# Patient Record
Sex: Male | Born: 1985 | Race: White | Hispanic: Yes | Marital: Married | Smoking: Never smoker
Health system: Southern US, Community
[De-identification: ages and names within clinical notes are randomized; demographics above are authoritative.]

## PROBLEM LIST (undated history)

## (undated) DIAGNOSIS — M1990 Unspecified osteoarthritis, unspecified site: Secondary | ICD-10-CM

## (undated) DIAGNOSIS — R0789 Other chest pain: Secondary | ICD-10-CM

## (undated) HISTORY — DX: Other chest pain: R07.89

## (undated) HISTORY — PX: APPENDECTOMY: SHX54

## (undated) HISTORY — PX: WISDOM TOOTH EXTRACTION: SHX21

---

## 2008-11-02 ENCOUNTER — Encounter (INDEPENDENT_AMBULATORY_CARE_PROVIDER_SITE_OTHER): Payer: Self-pay | Admitting: General Surgery

## 2008-11-02 ENCOUNTER — Observation Stay (HOSPITAL_COMMUNITY): Admission: EM | Admit: 2008-11-02 | Discharge: 2008-11-03 | Payer: Self-pay | Admitting: Emergency Medicine

## 2010-08-15 ENCOUNTER — Encounter: Admission: RE | Admit: 2010-08-15 | Discharge: 2010-08-15 | Payer: Self-pay | Admitting: Chiropractic Medicine

## 2011-01-21 LAB — CBC
HCT: 47.1 % (ref 39.0–52.0)
MCHC: 33.8 g/dL (ref 30.0–36.0)
MCV: 86.6 fL (ref 78.0–100.0)
Platelets: 198 10*3/uL (ref 150–400)
RDW: 12.7 % (ref 11.5–15.5)

## 2011-01-21 LAB — POCT I-STAT, CHEM 8
Creatinine, Ser: 1.1 mg/dL (ref 0.4–1.5)
HCT: 49 % (ref 39.0–52.0)
Hemoglobin: 16.7 g/dL (ref 13.0–17.0)
Sodium: 140 mEq/L (ref 135–145)
TCO2: 29 mmol/L (ref 0–100)

## 2011-01-21 LAB — URINALYSIS, ROUTINE W REFLEX MICROSCOPIC
Hgb urine dipstick: NEGATIVE
Protein, ur: NEGATIVE mg/dL
Urobilinogen, UA: 0.2 mg/dL (ref 0.0–1.0)

## 2011-01-21 LAB — DIFFERENTIAL
Basophils Absolute: 0.2 10*3/uL — ABNORMAL HIGH (ref 0.0–0.1)
Basophils Relative: 1 % (ref 0–1)
Eosinophils Absolute: 0 10*3/uL (ref 0.0–0.7)
Eosinophils Relative: 0 % (ref 0–5)
Lymphs Abs: 2.1 10*3/uL (ref 0.7–4.0)

## 2011-02-19 NOTE — Op Note (Signed)
NAME:  Dennis Carrillo, Dennis Carrillo  ACCOUNT NO.:  [id number]   MEDICAL RECORD NO.:  [id number]          PATIENT TYPE:  OBV   LOCATION:  2550                         FACILITY:  [facility]   PHYSICIAN:  Lennie Muckle, MD      DATE OF BIRTH:  Sep 20, 1986   DATE OF PROCEDURE:  DATE OF DISCHARGE:                               OPERATIVE REPORT   PREOPERATIVE DIAGNOSIS:  Acute appendicitis.   POSTOPERATIVE DIAGNOSIS:  Acute appendicitis.   PROCEDURE:  Laparoscopic appendectomy.   SURGEON:  Lennie Muckle, MD   ASSISTANT:  None.   ANESTHESIA:  General endotracheal anesthesia.   FINDINGS:  Long appendix adherent to the cecum.   SPECIMENS:  Appendix.   ESTIMATED BLOOD LOSS:  No immediate blood loss.   COMPLICATIONS:  No immediate complications.   DRAINS:  No drains were placed.   INDICATIONS FOR PROCEDURE:  Dennis Carrillo is a 25 year old male  who had abdominal pain, had a white count of 13.6.  He had CT findings  of a thickened appendix.  Informed consent was obtained for laparoscopic  appendectomy.  He received Zosyn preoperatively.   DETAILS OF PROCEDURE:  Dennis Carrillo was identified in the  preoperative holding area.  He received his IV, status post taken to the  operating room.  Once in the operating room, he was placed in the supine  position.  After administration of general endotracheal anesthesia, his  abdomen was prepped and draped in usual sterile fashion.  A time-out  procedure indicating the patient and procedure was formed.  I placed an  incision at the umbilicus.  Using the OptiVu, I placed a 5-mm trocar in  the abdominal cavity.  After adequate pneumo insufflation, I inspected  the abdominal cavity.  There was no evidence of injury by placing the  trocar.  I then placed a 5-mm trocar in the right side of the abdomen.  The patient was placed in a Trendelenburg right side up position.  The  cecum was identified in the right side of the abdomen.  The appendix  was  coursing along the right lateral wall.  I placed a 12-mm port in the  left lower quadrant.  I grasped the appendix and dissected sharply and  bluntly the lateral attachments of the appendix in the abdominal wall.  There was some mild inflammatory changes.  No purulent fluid noted.  I  then continued dissecting out the appendix.  I thought it easier to  dissect at the base and I stapled across the base with a laparoscopic  GIA stapler.  I then stapled across the mesoappendix.  I then continued  dissecting the appendix, which did course up along the wall of the  abdominal cavity adherent to the cecum.  This was rather a long  extensive dissection.  I continued dissecting the appendix.  I had  another piece of mesoappendix that I clipped and divided with  laparoscopic scissors.  As evident, we fully dissected at the tip of the  appendix.  I placed the appendix in the EndoCatch bag removed it from  the abdomen.  I then irrigated the abdomen with approximately a liter  and half of saline.  There appeared to be no evidence of bleeding.  Staple line was intact.  There appeared to be no evidence of injury in  the abdominal cavity.  Using a 0 Vicryl suture, I then closed the  fascial defect.  I then removed the trocars from the left lower  quadrant.  Finger palpation revealed closure.  Pneumoperitoneum was  released.  Trocars were removed.  Skin was closed with 4-0 Monocryl.  Dermabond placed upon dressing.  The patient had no catheter, was  extubated and transferred to postanesthesia care unit in stable  condition. He was given 1 dose of Zosyn postoperatively and will likely  be discharged home later in the morning.      Lennie Muckle, MD  Electronically Signed     ALA/MEDQ  D:  11/03/2008  T:  11/03/2008  Job:  [id number]

## 2011-02-19 NOTE — H&P (Signed)
NAME:  Dennis Carrillo, Dennis Carrillo NO.:  [id number]   MEDICAL RECORD NO.:  [id number]          PATIENT TYPE:  OBV   LOCATION:  5121                         FACILITY:  [facility]   PHYSICIAN:  Lennie Muckle, MD      DATE OF BIRTH:  06/12/1986   DATE OF ADMISSION:  11/02/2008  DATE OF DISCHARGE:                              HISTORY & PHYSICAL   REASON FOR ADMISSION:  Appendicitis.   HISTORY OF PRESENT ILLNESS:  Dennis Carrillo is 22-year male who  had onset abdominal pain approximately 1-2 days ago.  It was located  generally over the abdomen.  It then moved to his right side.  The pain  persisted today.  He had associated nausea, vomiting, and diarrhea.  He  came to the emergency department and had a CT scan, which did reveal a  thickened appendiceal inflammation.  White count was elevated at 13.6.   PAST MEDICAL HISTORY:  Negative.   FAMILY HISTORY:  Negative.   SOCIAL HISTORY:  Negative.   ALLERGIES:  No drug allergies.   MEDICATIONS:  No medications.   REVIEW OF SYSTEMS:  Negative.   PHYSICAL EXAMINATION:  VITAL SIGNS:  Temperature is 98.5, pulse is 82,  blood pressure 117/76.  GENERAL:  Overall, he is a young male in no acute distress, appears his  stated age, appears overall healthy.  HEENT:  Head is normocephalic.  Sclerae are clear.  CHEST:  Clear to auscultation bilaterally.  CARDIOVASCULAR:  Regular rate rhythm.  ABDOMEN:  He does have some tenderness in the right lower quadrant.  No  peritoneal signs.  SKIN:  Normal.  No rashes are seen.  No jaundice is noted.   CT is reviewed.  There is a thickened appendix with inflammation.   ASSESSMENT AND PLAN:  Acute appendicitis.   PLAN:  Laparoscopic appendectomy.  If all goes well, he may likely be  discharged home tomorrow.  I did talk to the family about the  possibility of a postoperative abscess.  He will be given Zosyn  preoperatively.      Lennie Muckle, MD  Electronically Signed     ALA/MEDQ   D:  11/02/2008  T:  11/03/2008  Job:  [id number]

## 2016-11-07 ENCOUNTER — Ambulatory Visit (HOSPITAL_COMMUNITY)
Admission: EM | Admit: 2016-11-07 | Discharge: 2016-11-07 | Disposition: A | Discharge status: Other Acute Inpatient Hospital | Payer: Self-pay

## 2018-05-22 ENCOUNTER — Other Ambulatory Visit: Payer: Self-pay | Admitting: Family Medicine

## 2018-05-22 ENCOUNTER — Ambulatory Visit
Admission: RE | Admit: 2018-05-22 | Discharge: 2018-05-22 | Disposition: A | Discharge status: Home / Self Care | Payer: Managed Care, Other (non HMO) | Source: Ambulatory Visit | Attending: Family Medicine | Admitting: Family Medicine

## 2018-05-22 DIAGNOSIS — M542 Cervicalgia: Secondary | ICD-10-CM

## 2018-05-22 DIAGNOSIS — M25562 Pain in left knee: Secondary | ICD-10-CM

## 2018-07-25 ENCOUNTER — Encounter (HOSPITAL_COMMUNITY): Payer: Self-pay | Admitting: Emergency Medicine

## 2018-07-25 ENCOUNTER — Emergency Department (HOSPITAL_COMMUNITY)
Admission: EM | Admit: 2018-07-25 | Discharge: 2018-07-25 | Disposition: A | Discharge status: Home / Self Care | Payer: 59 | Attending: Emergency Medicine | Admitting: Emergency Medicine

## 2018-07-25 ENCOUNTER — Emergency Department (HOSPITAL_COMMUNITY): Payer: 59

## 2018-07-25 DIAGNOSIS — R202 Paresthesia of skin: Secondary | ICD-10-CM | POA: Insufficient documentation

## 2018-07-25 DIAGNOSIS — M542 Cervicalgia: Secondary | ICD-10-CM | POA: Diagnosis present

## 2018-07-25 DIAGNOSIS — R531 Weakness: Secondary | ICD-10-CM | POA: Diagnosis not present

## 2018-07-25 DIAGNOSIS — D492 Neoplasm of unspecified behavior of bone, soft tissue, and skin: Secondary | ICD-10-CM

## 2018-07-25 DIAGNOSIS — R51 Headache: Secondary | ICD-10-CM | POA: Insufficient documentation

## 2018-07-25 HISTORY — DX: Neoplasm of unspecified behavior of bone, soft tissue, and skin: D49.2

## 2018-07-25 MED ORDER — NAPROXEN 250 MG PO TABS
500.0000 mg | ORAL_TABLET | Freq: Once | ORAL | Status: AC
  Administered 2018-07-25: 500 mg via ORAL
  Filled 2018-07-25: qty 2

## 2018-07-25 MED ORDER — LORAZEPAM 2 MG/ML IJ SOLN
1.0000 mg | Freq: Once | INTRAMUSCULAR | Status: AC | PRN
  Administered 2018-07-25: 1 mg via INTRAVENOUS
  Filled 2018-07-25: qty 1

## 2018-07-25 MED ORDER — GADOBUTROL 1 MMOL/ML IV SOLN
7.0000 mL | Freq: Once | INTRAVENOUS | Status: AC | PRN
  Administered 2018-07-25: 7 mL via INTRAVENOUS

## 2018-07-25 NOTE — ED Notes (Signed)
ED Provider at bedside. 

## 2018-07-25 NOTE — ED Notes (Signed)
Pt is back from MRI.

## 2018-07-25 NOTE — ED Notes (Signed)
Per neurosurgeon pt can be discharged and to follow up with neurosurgery outpatient follow up. PA aware.

## 2018-07-25 NOTE — ED Provider Notes (Signed)
[facility] EMERGENCY DEPARTMENT Provider Note   CSN: [id number] Arrival date & time: 07/25/18  7824     History   Chief Complaint Chief Complaint  Patient presents with  . Neck Pain    HPI Dennis Carrillo is a 32 y.o. male.  Patient with neck pain ongoing over the past 3 months.  Pain is on the left side of the neck and is worse when he looks to the right.  He also has pain that radiates up the back of his head causing him to have a headache.  States that sometimes his vision is blurry when he looks at something that is far away and sometimes he believes sees black spots when he opens his eyes abruptly, however denies any other vision changes.  He denies any weakness in the arms of the legs -- however upon more specific questioning, he reports some subjective weakness and tingling in the left arm that is been ongoing for about 3 years.  No difficulty walking.  He works at [employer] and does a lot of movement with his upper body.  He denies injury at onset.  States that he saw his primary care doctor who gave him muscle relaxers and pain medication which did not really help.  He has the pain every day.     No past medical history on file.  There are no active problems to display for this patient.   History reviewed. No pertinent surgical history.      Home Medications    Prior to Admission medications   Not on File    Family History No family history on file.  Social History Social History   Tobacco Use  . Smoking status: Not on file  Substance Use Topics  . Alcohol use: Not on file  . Drug use: Not on file     Allergies   Patient has no allergy information on record.   Review of Systems Review of Systems  Constitutional: Negative for activity change and fever.  HENT: Negative for rhinorrhea and sore throat.   Eyes: Negative for redness.  Respiratory: Negative for cough.   Cardiovascular: Negative for chest pain.    Gastrointestinal: Negative for abdominal pain, diarrhea, nausea and vomiting.  Genitourinary: Negative for dysuria.  Musculoskeletal: Positive for arthralgias, myalgias, neck pain and neck stiffness. Negative for back pain, gait problem and joint swelling.  Skin: Negative for rash and wound.  Neurological: Positive for weakness and numbness. Negative for headaches.     Physical Exam Updated Vital Signs BP (!) 131/92 (BP Location: Right Arm)   Pulse 67   Temp 98.5 F (36.9 C) (Oral)   Resp 18   SpO2 97%   Physical Exam  Constitutional: He is oriented to person, place, and time. He appears well-developed and well-nourished.  HENT:  Head: Normocephalic and atraumatic.  Right Ear: Tympanic membrane, external ear and ear canal normal.  Left Ear: Tympanic membrane, external ear and ear canal normal.  Nose: Nose normal.  Mouth/Throat: Uvula is midline, oropharynx is clear and moist and mucous membranes are normal.  Eyes: Pupils are equal, round, and reactive to light. Conjunctivae, EOM and lids are normal.  Neck: Normal range of motion. Neck supple.  Cardiovascular: Normal rate and regular rhythm.  Pulmonary/Chest: Effort normal and breath sounds normal. No respiratory distress.  Abdominal: Soft. There is no tenderness.  Musculoskeletal: Normal range of motion.       Cervical back: He exhibits normal range of motion,  no tenderness and no bony tenderness.  Patient with good range of motion of the neck but reports worsening pain in the left neck when he looks towards the right side.  Neurological: He is alert and oriented to person, place, and time. He has normal strength and normal reflexes. No cranial nerve deficit or sensory deficit. He exhibits normal muscle tone. He displays a negative Romberg sign. Coordination and gait normal. GCS eye subscore is 4. GCS verbal subscore is 5. GCS motor subscore is 6.  5/5 strength symmetric bilateral upper and lower extremities  Skin: Skin is warm  and dry.  Psychiatric: He has a normal mood and affect.  Nursing note and vitals reviewed.    ED Treatments / Results  Labs (all labs ordered are listed, but only abnormal results are displayed) Labs Reviewed - No data to display  EKG None  Radiology No results found.  Procedures Procedures (including critical care time)  Medications Ordered in ED Medications - No data to display   Initial Impression / Assessment and Plan / ED Course  I have reviewed the triage vital signs and the nursing notes.  Pertinent labs & imaging results that were available during my care of the patient were reviewed by me and considered in my medical decision making (see chart for details).     Patient seen and examined.  Given duration of pain, will check cervical spine films to evaluate for any signs of arthritis or other bony abnormality.  Initial impression is that symptoms seem to be musculoskeletal in nature.  They are worse with movement.  He does not have any neuro deficits.  He has some vision complaints after I asked him specifically, however do not really fit pattern expected with acute vascular compromise in the neck such as from a dissection or arterial occlusion.  I do not feel that angiography is necessary.  Given the fact that he has already used muscle relaxer medications and pain medication, and next that may be orthopedic referral.  Vital signs reviewed and are as follows: BP (!) 131/92 (BP Location: Right Arm)   Pulse 67   Temp 98.5 F (36.9 C) (Oral)   Resp 18   SpO2 97%   11:49 AM abnormality again noted on cervical spine imaging.  Given prolonged duration of symptoms without improvement with conservative measures --feel that MRI is warranted today for further evaluation.  Discussed with Dr. Ronnald Nian.  Patient updated and agrees with plan.  3:33 PM MRI shows a large schwannoma.  Patient updated.  Will discuss with neurosurgery.  Spoke with Dr. Joaquim Nam who will see patient  in the ED.   4:53 PM Handoff to [facility] PA-C at shift change. Awaiting neurosurg consultation in ED. Dispo per plan.   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   ED Discharge Orders    None       Carlisle Cater, PA-C 07/25/18 [place], [place], DO 07/25/18 1932

## 2018-07-25 NOTE — ED Notes (Signed)
Patient transported to MRI 

## 2018-07-25 NOTE — Consult Note (Signed)
Neurosurgery Consultation  Reason for Consult: Cervical spine tumor Referring Physician: Ronnald Nian  CC: Arm weakness  HPI: This is a 32 y.o. man that presents with acute on chronic left sided arm weakness and worsening ambulation. He has noticed symptoms over the past 3 years, came to the ED because he started falling to the left and having worsening pain in his neck and left arm. He has noticed some difficulty with fine motor movement in the hands.   ROS: A 14 point ROS was performed and is negative except as noted in the HPI.   PMHx: History reviewed. No pertinent past medical history. FamHx: No family history on file. SocHx:  has no tobacco, alcohol, and drug history on file.  Exam: Vital signs in last 24 hours: Temp:  [98.3 F (36.8 C)-98.6 F (37 C)] 98.3 F (36.8 C) (10/19 1453) Pulse Rate:  [60-67] 67 (10/19 2006) Resp:  [18-19] 18 (10/19 2006) BP: (117-131)/(76-92) 119/80 (10/19 2006) SpO2:  [96 %-100 %] 96 % (10/19 2006) General: Awake, alert, cooperative, lying in bed in NAD Head: normocephalic and atruamatic HEENT: neck supple Pulmonary: breathing room air comfortably, no evidence of increased work of breathing Cardiac: RRR Abdomen: S NT ND Extremities: warm and well perfused x4 Neuro: AOx3, PERRL, EOMI, FS Strength 5/5 on R, LUE diffusely 4+/5, +mild hoffman's on the left, diffusely decreased sensation in entire LUE, pain radiating into the R C6 distribution Reflexes 3+ in BLE w/ clonus and upgoing toes bilaterally   Assessment and Plan: 32 y.o. man with neck pain and symptoms of cervical myelopathy. 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  -discussed pathology and plan with patient and his partner. I explained that, if untreated, it will continue to grow and eventually cause paralysis and therefore it must be treated by someone in the near  future and emphasized the high importance of maintaining follow up, which they agreed to. -no acute intervention indicated, slow growing with slowly progressive symptoms, no acute worsening. Will schedule for elective surgical resection. Pt agreed to call if he notices any significant worsening before then.  Judith Part, MD 07/25/18 8:25 PM [facility]

## 2018-07-25 NOTE — ED Notes (Signed)
Neurosurgeon at bedside °

## 2018-07-25 NOTE — ED Provider Notes (Signed)
Care assumed from  [facility], PA-C at shift change with neurosurgery to see the patient. Please see his note for further details.   In brief, this patient is a 32 y.o. male presenting with neck pain. He was found to have a mass lesion extending through the left neural foramen at C5-6 that is most consistent with a nerve sheath tumor and has significant mass-effect on the cervical spine.  No noted deficits on exam.  Neurosurgery to see the patient.  PLAN: Neurosurgery to see the patient.   MDM:  Neurosurgery has seen the patient. Dr. Joaquim Nam recommends outpatient follow-up. His office will be in contact with the patient on Monday, 10/21 to schedule appointment for surgery/appt.   I advised the patient to follow-up with neurosurgery. He is to call Tuesday (information provided) if he does not receive a call from Dr. Sheldon Silvan office on Monday.  Specific return precautions discussed. Time was given for all questions to be answered. The patient verbalized understanding and agreement with plan. The patient appears safe for discharge home.  1. Nerve sheath tumor       Jillyn Ledger, PA-C 07/25/18 2001    Hayden Rasmussen, MD 07/26/18 [phone]

## 2018-07-25 NOTE — Discharge Instructions (Signed)
Dr. Zada Finders of neurosurgery has seen you in the department. He discussed his office will be in contact with you on Monday to schedule an appointment. If you do not hear from them on Monday, please call their office first thing on Tuesday.   Your MRI IMPRESSION: 1. Avidly enhancing heterogeneous mass lesion extends through and distends the left neural foramen at C5-6. There is a significant intracanalicular component. 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 2. Significant mass effect on the cervical spinal cord due to the nerve sheath tumor. The cord is displaced to the right. No definite cord signal abnormality is present.  Please return for any numbness or weakness in your arms or legs.  If you pee or defecate on yourself or if you have difficulties walking. If you develop worsening or new concerning symptoms you can return to the emergency department for re-evaluation.

## 2018-07-25 NOTE — ED Triage Notes (Signed)
Pt to ER for neck pain x3 months. Worsened by lateral movement. NAD. Denies injury.

## 2018-07-25 NOTE — ED Notes (Signed)
MRI contacted for ETA. 2 inpatients ahead of the patient then he would be next.

## 2018-07-30 ENCOUNTER — Other Ambulatory Visit: Payer: Self-pay | Admitting: Neurological Surgery

## 2018-08-06 NOTE — Progress Notes (Addendum)
PCP: Rodena Piety, MD  Cardiologist: pt denies  EKG: pt denies past year  Stress test: pt denies  ECHO: pt denies  Cardiac Cath: pt denies   Chest x-ray: pt denies past year, no recent respiratory infections/complications  Spanish interpreter requested for day of surgery.

## 2018-08-06 NOTE — Pre-Procedure Instructions (Signed)
[facility]  08/06/2018      [facility] [place], [place] - [address] [place] [address] Phone: [phone] Fax: [phone]    Your procedure is scheduled on August 19, 2018.  Report to [facility] Admitting at 630 AM.  Call this number if you have problems the morning of surgery:  [phone]   Remember:  Do not eat or drink after midnight.    Take these medicines the morning of surgery with A SIP OF WATER -none  7 days prior to surgery STOP taking any Aspirin (unless otherwise instructed by your surgeon), Aleve, Naproxen, Ibuprofen, Motrin, Advil, Goody's, BC's, all herbal medications, fish oil, and all vitamins     Do not wear jewelry  Do not wear lotions, powders, or colognes, or deodorant.  Men may shave face and neck.  Do not bring valuables to the hospital.  [facility] is not responsible for any belongings or valuables.  Contacts, dentures or bridgework may not be worn into surgery.  Leave your suitcase in the car.  After surgery it may be brought to your room.  For patients admitted to the hospital, discharge time will be determined by your treatment team.  Patients discharged the day of surgery will not be allowed to drive home.    [facility]- Preparing For Surgery  Before surgery, you can play an important role. Because skin is not sterile, your skin needs to be as free of germs as possible. You can reduce the number of germs on your skin by washing with CHG (chlorahexidine gluconate) Soap before surgery.  CHG is an antiseptic cleaner which kills germs and bonds with the skin to continue killing germs even after washing.    Oral Hygiene is also important to reduce your risk of infection.  Remember - BRUSH YOUR TEETH THE MORNING OF SURGERY WITH YOUR REGULAR TOOTHPASTE  Please do not use if you have an allergy to CHG or antibacterial soaps. If your skin becomes reddened/irritated  stop using the CHG.  Do not shave (including legs and underarms) for at least 48 hours prior to first CHG shower. It is OK to shave your face.  Please follow these instructions carefully.   1. Shower the NIGHT BEFORE SURGERY and the MORNING OF SURGERY with CHG.   2. If you chose to wash your hair, wash your hair first as usual with your normal shampoo.  3. After you shampoo, rinse your hair and body thoroughly to remove the shampoo.  4. Use CHG as you would any other liquid soap. You can apply CHG directly to the skin and wash gently with a scrungie or a clean washcloth.   5. Apply the CHG Soap to your body ONLY FROM THE NECK DOWN.  Do not use on open wounds or open sores. Avoid contact with your eyes, ears, mouth and genitals (private parts). Wash Face and genitals (private parts)  with your normal soap.  6. Wash thoroughly, paying special attention to the area where your surgery will be performed.  7. Thoroughly rinse your body with warm water from the neck down.  8. DO NOT shower/wash with your normal soap after using and rinsing off the CHG Soap.  9. Pat yourself dry with a CLEAN TOWEL.  10. Wear CLEAN PAJAMAS to bed the night before surgery, wear comfortable clothes the morning of surgery  11. Place CLEAN SHEETS on your bed the night of your first shower  and DO NOT SLEEP WITH PETS.  Day of Surgery:  Do not apply any deodorants/lotions.  Please wear clean clothes to the hospital/surgery center.   Remember to brush your teeth WITH YOUR REGULAR TOOTHPASTE.  Please read over the following fact sheets that you were given.

## 2018-08-07 ENCOUNTER — Encounter (HOSPITAL_COMMUNITY)
Admission: RE | Admit: 2018-08-07 | Discharge: 2018-08-07 | Disposition: A | Discharge status: Home / Self Care | Payer: 59 | Source: Ambulatory Visit | Attending: Neurological Surgery | Admitting: Neurological Surgery

## 2018-08-07 ENCOUNTER — Other Ambulatory Visit: Payer: Self-pay

## 2018-08-07 ENCOUNTER — Encounter (HOSPITAL_COMMUNITY): Payer: Self-pay

## 2018-08-07 DIAGNOSIS — Z01812 Encounter for preprocedural laboratory examination: Secondary | ICD-10-CM | POA: Diagnosis not present

## 2018-08-07 DIAGNOSIS — C720 Malignant neoplasm of spinal cord: Secondary | ICD-10-CM | POA: Insufficient documentation

## 2018-08-07 HISTORY — PX: NECK SURGERY: SHX720

## 2018-08-07 HISTORY — DX: Unspecified osteoarthritis, unspecified site: M19.90

## 2018-08-07 LAB — TYPE AND SCREEN
ABO/RH(D): O POS
ANTIBODY SCREEN: NEGATIVE

## 2018-08-07 LAB — SURGICAL PCR SCREEN
MRSA, PCR: NEGATIVE
Staphylococcus aureus: NEGATIVE

## 2018-08-07 LAB — CBC
HCT: 49.6 % (ref 39.0–52.0)
HEMOGLOBIN: 16.3 g/dL (ref 13.0–17.0)
MCH: 28.1 pg (ref 26.0–34.0)
MCHC: 32.9 g/dL (ref 30.0–36.0)
MCV: 85.4 fL (ref 80.0–100.0)
PLATELETS: 232 10*3/uL (ref 150–400)
RBC: 5.81 MIL/uL (ref 4.22–5.81)
RDW: 12.3 % (ref 11.5–15.5)
WBC: 8 10*3/uL (ref 4.0–10.5)
nRBC: 0 % (ref 0.0–0.2)

## 2018-08-07 LAB — ABO/RH: ABO/RH(D): O POS

## 2018-08-19 ENCOUNTER — Other Ambulatory Visit: Payer: Self-pay

## 2018-08-19 ENCOUNTER — Inpatient Hospital Stay (HOSPITAL_COMMUNITY): Payer: Managed Care, Other (non HMO)

## 2018-08-19 ENCOUNTER — Encounter (HOSPITAL_COMMUNITY): Payer: Self-pay

## 2018-08-19 ENCOUNTER — Encounter (HOSPITAL_COMMUNITY)
Admission: RE | Disposition: A | Discharge status: Home / Self Care | Payer: Self-pay | Source: Home / Self Care | Attending: Neurological Surgery

## 2018-08-19 ENCOUNTER — Inpatient Hospital Stay (HOSPITAL_COMMUNITY): Payer: Managed Care, Other (non HMO) | Admitting: Certified Registered"

## 2018-08-19 ENCOUNTER — Inpatient Hospital Stay (HOSPITAL_COMMUNITY)
Admission: RE | Admit: 2018-08-19 | Discharge: 2018-08-26 | DRG: 030 | Disposition: A | Discharge status: Home / Self Care | Payer: Managed Care, Other (non HMO) | Attending: Neurological Surgery | Admitting: Neurological Surgery

## 2018-08-19 DIAGNOSIS — R338 Other retention of urine: Secondary | ICD-10-CM | POA: Diagnosis not present

## 2018-08-19 DIAGNOSIS — D334 Benign neoplasm of spinal cord: Secondary | ICD-10-CM | POA: Diagnosis present

## 2018-08-19 DIAGNOSIS — G959 Disease of spinal cord, unspecified: Secondary | ICD-10-CM | POA: Diagnosis present

## 2018-08-19 DIAGNOSIS — G55 Nerve root and plexus compressions in diseases classified elsewhere: Secondary | ICD-10-CM | POA: Diagnosis present

## 2018-08-19 DIAGNOSIS — D492 Neoplasm of unspecified behavior of bone, soft tissue, and skin: Secondary | ICD-10-CM

## 2018-08-19 DIAGNOSIS — Z419 Encounter for procedure for purposes other than remedying health state, unspecified: Secondary | ICD-10-CM

## 2018-08-19 HISTORY — PX: LAMINECTOMY: SHX219

## 2018-08-19 HISTORY — DX: Neoplasm of unspecified behavior of bone, soft tissue, and skin: D49.2

## 2018-08-19 SURGERY — CERVICAL LAMINECTOMY FOR TUMOR
Anesthesia: General | Site: Neck

## 2018-08-19 MED ORDER — FENTANYL CITRATE (PF) 250 MCG/5ML IJ SOLN
INTRAMUSCULAR | Status: AC
  Filled 2018-08-19: qty 5

## 2018-08-19 MED ORDER — LACTATED RINGERS IV SOLN
INTRAVENOUS | Status: DC | PRN
  Administered 2018-08-19 (×3): via INTRAVENOUS

## 2018-08-19 MED ORDER — LIDOCAINE 2% (20 MG/ML) 5 ML SYRINGE
INTRAMUSCULAR | Status: DC | PRN
  Administered 2018-08-19: 100 mg via INTRAVENOUS

## 2018-08-19 MED ORDER — LIDOCAINE-EPINEPHRINE 1 %-1:100000 IJ SOLN
INTRAMUSCULAR | Status: DC | PRN
  Administered 2018-08-19: 6 mL

## 2018-08-19 MED ORDER — PHENOL 1.4 % MT LIQD: 1.0000 | OROMUCOSAL | Status: DC | PRN

## 2018-08-19 MED ORDER — DEXAMETHASONE SODIUM PHOSPHATE 10 MG/ML IJ SOLN
INTRAMUSCULAR | Status: AC
  Filled 2018-08-19: qty 1

## 2018-08-19 MED ORDER — 0.9 % SODIUM CHLORIDE (POUR BTL) OPTIME
TOPICAL | Status: DC | PRN
  Administered 2018-08-19: 1000 mL

## 2018-08-19 MED ORDER — OXYCODONE HCL 5 MG PO TABS
10.0000 mg | ORAL_TABLET | ORAL | Status: DC | PRN
  Administered 2018-08-19 – 2018-08-20 (×5): 10 mg via ORAL
  Filled 2018-08-19 (×5): qty 2

## 2018-08-19 MED ORDER — HEMOSTATIC AGENTS (NO CHARGE) OPTIME
TOPICAL | Status: DC | PRN
  Administered 2018-08-19: 1 via TOPICAL

## 2018-08-19 MED ORDER — DEXAMETHASONE SODIUM PHOSPHATE 4 MG/ML IJ SOLN: 4.0000 mg | Freq: Three times a day (TID) | INTRAMUSCULAR | Status: AC

## 2018-08-19 MED ORDER — SODIUM CHLORIDE 0.9 % IV SOLN
0.0500 ug/kg/min | INTRAVENOUS | Status: DC
  Administered 2018-08-19: 0.05 ug/kg/min via INTRAVENOUS
  Filled 2018-08-19: qty 2000
  Filled 2018-08-19: qty 5000

## 2018-08-19 MED ORDER — LACTATED RINGERS IV SOLN
INTRAVENOUS | Status: DC | PRN
  Administered 2018-08-19: 08:00:00 via INTRAVENOUS

## 2018-08-19 MED ORDER — ARTIFICIAL TEARS OPHTHALMIC OINT
TOPICAL_OINTMENT | OPHTHALMIC | Status: AC
  Filled 2018-08-19: qty 3.5

## 2018-08-19 MED ORDER — ONDANSETRON HCL 4 MG PO TABS: 4.0000 mg | ORAL_TABLET | Freq: Four times a day (QID) | ORAL | Status: DC | PRN

## 2018-08-19 MED ORDER — POLYETHYLENE GLYCOL 3350 17 G PO PACK
17.0000 g | PACK | Freq: Every day | ORAL | Status: DC | PRN
  Administered 2018-08-20 – 2018-08-24 (×3): 17 g via ORAL
  Filled 2018-08-19 (×3): qty 1

## 2018-08-19 MED ORDER — CHLORHEXIDINE GLUCONATE CLOTH 2 % EX PADS: 6.0000 | MEDICATED_PAD | Freq: Once | CUTANEOUS | Status: DC

## 2018-08-19 MED ORDER — DEXMEDETOMIDINE HCL IN NACL 200 MCG/50ML IV SOLN
INTRAVENOUS | Status: DC | PRN
  Administered 2018-08-19 (×2): 20 ug via INTRAVENOUS

## 2018-08-19 MED ORDER — THROMBIN 5000 UNITS EX SOLR
CUTANEOUS | Status: AC
  Filled 2018-08-19: qty 10000

## 2018-08-19 MED ORDER — CEFAZOLIN SODIUM-DEXTROSE 2-4 GM/100ML-% IV SOLN
2.0000 g | INTRAVENOUS | Status: AC
  Administered 2018-08-19 (×2): 2 g via INTRAVENOUS
  Filled 2018-08-19: qty 100

## 2018-08-19 MED ORDER — OXYCODONE HCL 5 MG PO TABS: 5.0000 mg | ORAL_TABLET | ORAL | Status: DC | PRN

## 2018-08-19 MED ORDER — ALBUMIN HUMAN 5 % IV SOLN
INTRAVENOUS | Status: DC | PRN
  Administered 2018-08-19 (×2): via INTRAVENOUS

## 2018-08-19 MED ORDER — CEFAZOLIN SODIUM 1 G IJ SOLR
INTRAMUSCULAR | Status: AC
  Filled 2018-08-19: qty 20

## 2018-08-19 MED ORDER — PHENYLEPHRINE HCL 10 MG/ML IJ SOLN
INTRAMUSCULAR | Status: DC | PRN
  Administered 2018-08-19: 120 ug via INTRAVENOUS
  Administered 2018-08-19 (×5): 80 ug via INTRAVENOUS
  Administered 2018-08-19: 120 ug via INTRAVENOUS

## 2018-08-19 MED ORDER — DOCUSATE SODIUM 100 MG PO CAPS
100.0000 mg | ORAL_CAPSULE | Freq: Two times a day (BID) | ORAL | Status: DC
  Administered 2018-08-19 – 2018-08-26 (×14): 100 mg via ORAL
  Filled 2018-08-19 (×14): qty 1

## 2018-08-19 MED ORDER — HYDROMORPHONE HCL 1 MG/ML IJ SOLN
0.5000 mg | INTRAMUSCULAR | Status: DC | PRN
  Administered 2018-08-19 – 2018-08-20 (×6): 0.5 mg via INTRAVENOUS
  Filled 2018-08-19 (×6): qty 1

## 2018-08-19 MED ORDER — CEFAZOLIN SODIUM-DEXTROSE 2-4 GM/100ML-% IV SOLN
2.0000 g | Freq: Three times a day (TID) | INTRAVENOUS | Status: AC
  Administered 2018-08-19 – 2018-08-20 (×2): 2 g via INTRAVENOUS
  Filled 2018-08-19 (×2): qty 100

## 2018-08-19 MED ORDER — ARTIFICIAL TEARS OPHTHALMIC OINT
TOPICAL_OINTMENT | OPHTHALMIC | Status: DC | PRN
  Administered 2018-08-19: 1 via OPHTHALMIC

## 2018-08-19 MED ORDER — PROPOFOL 10 MG/ML IV BOLUS
INTRAVENOUS | Status: AC
  Filled 2018-08-19: qty 40

## 2018-08-19 MED ORDER — ACETAMINOPHEN 650 MG RE SUPP: 650.0000 mg | RECTAL | Status: DC | PRN

## 2018-08-19 MED ORDER — FENTANYL CITRATE (PF) 100 MCG/2ML IJ SOLN
INTRAMUSCULAR | Status: DC | PRN
  Administered 2018-08-19: 100 ug via INTRAVENOUS
  Administered 2018-08-19: 150 ug via INTRAVENOUS
  Administered 2018-08-19: 50 ug via INTRAVENOUS

## 2018-08-19 MED ORDER — DEXAMETHASONE SODIUM PHOSPHATE 10 MG/ML IJ SOLN
INTRAMUSCULAR | Status: DC | PRN
  Administered 2018-08-19: 10 mg via INTRAVENOUS

## 2018-08-19 MED ORDER — SODIUM CHLORIDE 0.9 % IV SOLN
250.0000 mL | INTRAVENOUS | Status: DC
  Administered 2018-08-20: 250 mL via INTRAVENOUS

## 2018-08-19 MED ORDER — SODIUM CHLORIDE 0.9% FLUSH
3.0000 mL | Freq: Two times a day (BID) | INTRAVENOUS | Status: DC
  Administered 2018-08-19 – 2018-08-26 (×9): 3 mL via INTRAVENOUS

## 2018-08-19 MED ORDER — PROPOFOL 1000 MG/100ML IV EMUL
INTRAVENOUS | Status: AC
  Filled 2018-08-19: qty 100

## 2018-08-19 MED ORDER — MIDAZOLAM HCL 2 MG/2ML IJ SOLN
INTRAMUSCULAR | Status: AC
  Filled 2018-08-19: qty 2

## 2018-08-19 MED ORDER — SODIUM CHLORIDE 0.9% FLUSH: 3.0000 mL | INTRAVENOUS | Status: DC | PRN

## 2018-08-19 MED ORDER — ACETAMINOPHEN 325 MG PO TABS
650.0000 mg | ORAL_TABLET | ORAL | Status: DC | PRN
  Administered 2018-08-19 – 2018-08-26 (×16): 650 mg via ORAL
  Filled 2018-08-19 (×16): qty 2

## 2018-08-19 MED ORDER — MEPERIDINE HCL 50 MG/ML IJ SOLN: 6.2500 mg | INTRAMUSCULAR | Status: DC | PRN

## 2018-08-19 MED ORDER — PHENYLEPHRINE 40 MCG/ML (10ML) SYRINGE FOR IV PUSH (FOR BLOOD PRESSURE SUPPORT)
PREFILLED_SYRINGE | INTRAVENOUS | Status: AC
  Filled 2018-08-19: qty 10

## 2018-08-19 MED ORDER — PROPOFOL 10 MG/ML IV BOLUS
INTRAVENOUS | Status: DC | PRN
  Administered 2018-08-19: 100 mg via INTRAVENOUS
  Administered 2018-08-19: 20 mg via INTRAVENOUS
  Administered 2018-08-19: 50 mg via INTRAVENOUS

## 2018-08-19 MED ORDER — SODIUM CHLORIDE 0.9 % IV SOLN
INTRAVENOUS | Status: DC | PRN
  Administered 2018-08-19: 20 ug/min via INTRAVENOUS

## 2018-08-19 MED ORDER — SODIUM CHLORIDE 0.9 % IV SOLN
INTRAVENOUS | Status: DC | PRN
  Administered 2018-08-19: 10:00:00

## 2018-08-19 MED ORDER — MIDAZOLAM HCL 5 MG/5ML IJ SOLN
INTRAMUSCULAR | Status: DC | PRN
  Administered 2018-08-19: 2 mg via INTRAVENOUS

## 2018-08-19 MED ORDER — ONDANSETRON HCL 4 MG/2ML IJ SOLN
INTRAMUSCULAR | Status: AC
  Filled 2018-08-19: qty 2

## 2018-08-19 MED ORDER — BUPIVACAINE HCL (PF) 0.5 % IJ SOLN
INTRAMUSCULAR | Status: AC
  Filled 2018-08-19: qty 30

## 2018-08-19 MED ORDER — PROPOFOL 500 MG/50ML IV EMUL
INTRAVENOUS | Status: DC | PRN
  Administered 2018-08-19: 50 ug/kg/min via INTRAVENOUS
  Administered 2018-08-19: 13:00:00 via INTRAVENOUS

## 2018-08-19 MED ORDER — SUCCINYLCHOLINE CHLORIDE 200 MG/10ML IV SOSY
PREFILLED_SYRINGE | INTRAVENOUS | Status: AC
  Filled 2018-08-19: qty 10

## 2018-08-19 MED ORDER — HYDROMORPHONE HCL 1 MG/ML IJ SOLN
INTRAMUSCULAR | Status: AC
  Filled 2018-08-19: qty 1

## 2018-08-19 MED ORDER — LIDOCAINE-EPINEPHRINE 1 %-1:100000 IJ SOLN
INTRAMUSCULAR | Status: AC
  Filled 2018-08-19: qty 1

## 2018-08-19 MED ORDER — BACITRACIN ZINC 500 UNIT/GM EX OINT
TOPICAL_OINTMENT | CUTANEOUS | Status: AC
  Filled 2018-08-19: qty 28.35

## 2018-08-19 MED ORDER — MENTHOL 3 MG MT LOZG
1.0000 | LOZENGE | OROMUCOSAL | Status: DC | PRN
  Administered 2018-08-19 – 2018-08-20 (×2): 3 mg via ORAL
  Filled 2018-08-19: qty 9

## 2018-08-19 MED ORDER — DEXAMETHASONE 4 MG PO TABS
4.0000 mg | ORAL_TABLET | Freq: Three times a day (TID) | ORAL | Status: AC
  Administered 2018-08-19 – 2018-08-20 (×3): 4 mg via ORAL
  Filled 2018-08-19 (×3): qty 1

## 2018-08-19 MED ORDER — ONDANSETRON HCL 4 MG/2ML IJ SOLN
INTRAMUSCULAR | Status: DC | PRN
  Administered 2018-08-19: 4 mg via INTRAVENOUS

## 2018-08-19 MED ORDER — HYDROMORPHONE HCL 1 MG/ML IJ SOLN
0.2500 mg | INTRAMUSCULAR | Status: DC | PRN
  Administered 2018-08-19: 0.5 mg via INTRAVENOUS

## 2018-08-19 MED ORDER — BACITRACIN ZINC 500 UNIT/GM EX OINT
TOPICAL_OINTMENT | CUTANEOUS | Status: DC | PRN
  Administered 2018-08-19: 1 via TOPICAL

## 2018-08-19 MED ORDER — SUCCINYLCHOLINE CHLORIDE 20 MG/ML IJ SOLN
INTRAMUSCULAR | Status: DC | PRN
  Administered 2018-08-19: 160 mg via INTRAVENOUS

## 2018-08-19 MED ORDER — ONDANSETRON HCL 4 MG/2ML IJ SOLN: 4.0000 mg | Freq: Four times a day (QID) | INTRAMUSCULAR | Status: DC | PRN

## 2018-08-19 MED ORDER — LIDOCAINE 2% (20 MG/ML) 5 ML SYRINGE
INTRAMUSCULAR | Status: AC
  Filled 2018-08-19: qty 5

## 2018-08-19 MED ORDER — ONDANSETRON HCL 4 MG/2ML IJ SOLN: 4.0000 mg | Freq: Once | INTRAMUSCULAR | Status: DC | PRN

## 2018-08-19 MED ORDER — THROMBIN 5000 UNITS EX SOLR
OROMUCOSAL | Status: DC | PRN
  Administered 2018-08-19 (×2): via TOPICAL

## 2018-08-19 SURGICAL SUPPLY — 82 items
BAG DECANTER FOR FLEXI CONT (MISCELLANEOUS) ×3 IMPLANT
BASKET BONE COLLECTION (BASKET) ×3 IMPLANT
BENZOIN TINCTURE PRP APPL 2/3 (GAUZE/BANDAGES/DRESSINGS) IMPLANT
BIT DRILL 2.4 (BIT) ×2 IMPLANT
BIT DRILL 2.4MM (BIT) ×1
BLADE CLIPPER SURG (BLADE) ×3 IMPLANT
BLADE SURG 11 STRL SS (BLADE) ×3 IMPLANT
BUR MATCHSTICK NEURO 3.0 LAGG (BURR) ×3 IMPLANT
BUR PRECISION FLUTE 5.0 (BURR) ×3 IMPLANT
CANISTER SUCT 3000ML PPV (MISCELLANEOUS) ×3 IMPLANT
CLOSURE WOUND 1/2 X4 (GAUZE/BANDAGES/DRESSINGS)
CONT SPEC 4OZ CLIKSEAL STRL BL (MISCELLANEOUS) ×6 IMPLANT
COVER WAND RF STERILE (DRAPES) ×3 IMPLANT
DECANTER SPIKE VIAL GLASS SM (MISCELLANEOUS) ×3 IMPLANT
DERMABOND ADVANCED (GAUZE/BANDAGES/DRESSINGS) ×2
DERMABOND ADVANCED .7 DNX12 (GAUZE/BANDAGES/DRESSINGS) ×1 IMPLANT
DRAPE C-ARM 42X72 X-RAY (DRAPES) ×6 IMPLANT
DRAPE LAPAROTOMY T 98X78 PEDS (DRAPES) ×3 IMPLANT
DRAPE MICROSCOPE LEICA (MISCELLANEOUS) ×3 IMPLANT
DRAPE SURG 17X23 STRL (DRAPES) IMPLANT
DURAPREP 26ML APPLICATOR (WOUND CARE) ×3 IMPLANT
ELECT REM PT RETURN 9FT ADLT (ELECTROSURGICAL) ×3
ELECTRODE REM PT RTRN 9FT ADLT (ELECTROSURGICAL) ×1 IMPLANT
FEE INTRAOP MONITOR IMPULS NCS (MISCELLANEOUS) ×1 IMPLANT
FLOSEAL 5ML (HEMOSTASIS) IMPLANT
GAUZE 4X4 16PLY RFD (DISPOSABLE) IMPLANT
GAUZE SPONGE 4X4 12PLY STRL (GAUZE/BANDAGES/DRESSINGS) IMPLANT
GLOVE BIO SURGEON STRL SZ7.5 (GLOVE) ×6 IMPLANT
GLOVE BIO SURGEON STRL SZ8 (GLOVE) ×6 IMPLANT
GLOVE BIOGEL PI IND STRL 7.5 (GLOVE) ×7 IMPLANT
GLOVE BIOGEL PI INDICATOR 7.5 (GLOVE) ×14
GLOVE ECLIPSE 7.5 STRL STRAW (GLOVE) ×3 IMPLANT
GLOVE EXAM NITRILE LRG STRL (GLOVE) IMPLANT
GLOVE EXAM NITRILE XL STR (GLOVE) IMPLANT
GLOVE EXAM NITRILE XS STR PU (GLOVE) IMPLANT
GLOVE INDICATOR 8.5 STRL (GLOVE) ×6 IMPLANT
GLOVE SURG SS PI 7.0 STRL IVOR (GLOVE) ×18 IMPLANT
GOWN STRL REUS W/ TWL LRG LVL3 (GOWN DISPOSABLE) ×5 IMPLANT
GOWN STRL REUS W/ TWL XL LVL3 (GOWN DISPOSABLE) ×3 IMPLANT
GOWN STRL REUS W/TWL 2XL LVL3 (GOWN DISPOSABLE) IMPLANT
GOWN STRL REUS W/TWL LRG LVL3 (GOWN DISPOSABLE) ×10
GOWN STRL REUS W/TWL XL LVL3 (GOWN DISPOSABLE) ×6
GRAFT DURAGEN MATRIX 1WX1L (Tissue) ×3 IMPLANT
HEMOSTAT POWDER KIT SURGIFOAM (HEMOSTASIS) ×6 IMPLANT
INTRAOP MONITOR FEE IMPULS NCS (MISCELLANEOUS) ×1
INTRAOP MONITOR FEE IMPULSE (MISCELLANEOUS) ×2
KIT BASIN OR (CUSTOM PROCEDURE TRAY) ×3 IMPLANT
KIT TURNOVER KIT B (KITS) ×3 IMPLANT
MILL MEDIUM DISP (BLADE) ×3 IMPLANT
NEEDLE HYPO 18GX1.5 BLUNT FILL (NEEDLE) IMPLANT
NEEDLE HYPO 22GX1.5 SAFETY (NEEDLE) ×3 IMPLANT
NEEDLE SPNL 18GX3.5 QUINCKE PK (NEEDLE) ×3 IMPLANT
NS IRRIG 1000ML POUR BTL (IV SOLUTION) ×3 IMPLANT
PACK LAMINECTOMY NEURO (CUSTOM PROCEDURE TRAY) ×3 IMPLANT
PAD ARMBOARD 7.5X6 YLW CONV (MISCELLANEOUS) ×9 IMPLANT
PATTIES SURGICAL .25X.25 (GAUZE/BANDAGES/DRESSINGS) ×6 IMPLANT
PATTIES SURGICAL .5 X.5 (GAUZE/BANDAGES/DRESSINGS) ×3 IMPLANT
PIN MAYFIELD SKULL DISP (PIN) ×3 IMPLANT
PROBE FOR NEUROSURGERY (MISCELLANEOUS) ×3 IMPLANT
PROBE NERVBE PRASS .33 (MISCELLANEOUS) ×3 IMPLANT
ROD PRE CUT 3.5X40MM SPINAL (Rod) ×6 IMPLANT
RUBBERBAND STERILE (MISCELLANEOUS) ×6 IMPLANT
SCREW MA INFINITY 3.5X12 (Screw) ×18 IMPLANT
SCREW SET 3600315 STANDARD (Screw) ×18 IMPLANT
SCREW SET 3600315 STD (Screw) ×6 IMPLANT
SEALANT ADHERUS EXTEND TIP (MISCELLANEOUS) ×3 IMPLANT
SPONGE LAP 4X18 RFD (DISPOSABLE) IMPLANT
SPONGE SURGIFOAM ABS GEL 100 (HEMOSTASIS) IMPLANT
STRIP CLOSURE SKIN 1/2X4 (GAUZE/BANDAGES/DRESSINGS) IMPLANT
SUT MNCRL AB 3-0 PS2 18 (SUTURE) ×3 IMPLANT
SUT NURALON 4 0 TR CR/8 (SUTURE) ×6 IMPLANT
SUT PROLENE 6 0 BV (SUTURE) IMPLANT
SUT VIC AB 0 CT1 18XCR BRD8 (SUTURE) ×2 IMPLANT
SUT VIC AB 0 CT1 8-18 (SUTURE) ×4
SUT VIC AB 2-0 CP2 18 (SUTURE) ×9 IMPLANT
SYR 3ML LL SCALE MARK (SYRINGE) IMPLANT
TIP NONSTICK .5MMX23CM (INSTRUMENTS) ×2
TIP NONSTICK .5X23 (INSTRUMENTS) ×1 IMPLANT
TOWEL GREEN STERILE (TOWEL DISPOSABLE) ×3 IMPLANT
TOWEL GREEN STERILE FF (TOWEL DISPOSABLE) ×3 IMPLANT
TRAY FOLEY MTR SLVR 16FR STAT (SET/KITS/TRAYS/PACK) ×3 IMPLANT
WATER STERILE IRR 1000ML POUR (IV SOLUTION) ×3 IMPLANT

## 2018-08-19 NOTE — Anesthesia Procedure Notes (Addendum)
Procedure Name: Intubation Date/Time: 08/19/2018 8:38 AM Performed by: White, Amedeo Plenty, CRNA Pre-anesthesia Checklist: Patient identified, Emergency Drugs available, Suction available and Patient being monitored Patient Re-evaluated:Patient Re-evaluated prior to induction Oxygen Delivery Method: Circle System Utilized Preoxygenation: Pre-oxygenation with 100% oxygen Induction Type: IV induction Ventilation: Mask ventilation without difficulty Laryngoscope Size: Glidescope and 4 (elective glidescope due to cervical neuropathies and pain from neck extension) Grade View: Grade I Tube type: Oral Tube size: 7.5 mm Number of attempts: 1 Airway Equipment and Method: Stylet and Oral airway Placement Confirmation: ETT inserted through vocal cords under direct vision,  positive ETCO2 and breath sounds checked- equal and bilateral Secured at: 23 cm Tube secured with: Tape Dental Injury: Teeth and Oropharynx as per pre-operative assessment

## 2018-08-19 NOTE — Transfer of Care (Signed)
Immediate Anesthesia Transfer of Care Note  Patient: Dennis Carrillo  Procedure(s) Performed: Cervical four to Cervical six posterior cervical laminectomy for intradural tumor, cervical four to cervical six instrumented fusion (N/A Neck)  Patient Location: PACU  Anesthesia Type:General  Level of Consciousness: drowsy and responds to stimulation  Airway & Oxygen Therapy: Patient Spontanous Breathing and Patient connected to face mask oxygen  Post-op Assessment: Report given to RN and Post -op Vital signs reviewed and stable  Post vital signs: Reviewed and stable  Last Vitals:  Vitals Value Taken Time  BP 126/82 08/19/2018  5:24 PM  Temp    Pulse 94 08/19/2018  5:26 PM  Resp 15 08/19/2018  5:26 PM  SpO2 96 % 08/19/2018  5:26 PM  Vitals shown include unvalidated device data.  Last Pain:  Vitals:   08/19/18 0653  TempSrc:   PainSc: 9       Patients Stated Pain Goal: 2 (35/00/93 8182)  Complications: No apparent anesthesia complications

## 2018-08-19 NOTE — Anesthesia Preprocedure Evaluation (Addendum)
Anesthesia Evaluation  Patient identified by MRN, date of birth, ID band Patient awake    Reviewed: Allergy & Precautions, NPO status , Patient's Chart, lab work & pertinent test results  Airway Mallampati: II  TM Distance: >3 FB     Dental   Pulmonary    Pulmonary exam normal breath sounds clear to auscultation       Cardiovascular negative cardio ROS Normal cardiovascular exam Rhythm:Regular Rate:Normal     Neuro/Psych    GI/Hepatic negative GI ROS, Neg liver ROS,   Endo/Other  negative endocrine ROS  Renal/GU negative Renal ROS     Musculoskeletal   Abdominal   Peds  Hematology   Anesthesia Other Findings   Reproductive/Obstetrics                            Anesthesia Physical Anesthesia Plan  ASA: III  Anesthesia Plan: General   Post-op Pain Management:    Induction: Intravenous  PONV Risk Score and Plan: 2 and Treatment may vary due to age or medical condition  Airway Management Planned: Oral ETT  Additional Equipment: Arterial line  Intra-op Plan:   Post-operative Plan: Possible Post-op intubation/ventilation  Informed Consent: I have reviewed the patients History and Physical, chart, labs and discussed the procedure including the risks, benefits and alternatives for the proposed anesthesia with the patient or authorized representative who has indicated his/her understanding and acceptance.   Dental advisory given  Plan Discussed with: CRNA and Surgeon  Anesthesia Plan Comments:        Anesthesia Quick Evaluation

## 2018-08-19 NOTE — Anesthesia Procedure Notes (Signed)
Arterial Line Insertion Start/End11/13/2019 8:35 AM Performed by: Lavell Luster, CRNA, CRNA  Left, radial was placed Catheter size: 20 G Hand hygiene performed  and maximum sterile barriers used  Allen's test indicative of satisfactory collateral circulation Attempts: 3 (attempt x3 by SRNA, attempt x1 by CRNA) Procedure performed without using ultrasound guided technique. Following insertion, dressing applied and Biopatch. Post procedure assessment: normal  Patient tolerated the procedure well with no immediate complications.

## 2018-08-19 NOTE — Brief Op Note (Signed)
08/19/2018  5:09 PM  PATIENT:  Dennis Carrillo  32 y.o. male  PRE-OPERATIVE DIAGNOSIS:  Spinal cord neoplasm  POST-OPERATIVE DIAGNOSIS:  Spinal cord neoplasm  PROCEDURE:  Procedure(s): Cervical four to Cervical six posterior cervical laminectomy for intradural tumor, cervical four to cervical six instrumented fusion (N/A)  SURGEON:  Surgeon(s) and Role:    * Judith Part, MD - Primary    * Kary Kos, MD - Assisting  ANESTHESIA:   general  EBL:  850 mL   BLOOD ADMINISTERED:none  DRAINS: none   LOCAL MEDICATIONS USED:  LIDOCAINE   SPECIMEN:  Source of Specimen:  cervical nerve sheath tumor  DISPOSITION OF SPECIMEN:  PATHOLOGY  COUNTS:  YES  TOURNIQUET:  * No tourniquets in log *  DICTATION: .Note written in EPIC  PLAN OF CARE: Admit to inpatient   PATIENT DISPOSITION:  PACU - hemodynamically stable.   Delay start of Pharmacological VTE agent (>24hrs) due to surgical blood loss or risk of bleeding: yes

## 2018-08-19 NOTE — Op Note (Signed)
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 This was discussed with the patient as well as risks, benefits, and alternatives and wished to proceed with surgical resection.   OPERATIVE DETAIL: The patient was taken to the operating room and anesthesia was induced in the supine position. Baseline SSEP/MEPs were obtained the patient was placed in a [place] head holder, then placed on the OR table in the prone position with stable signals. A formal time out was performed with two patient identifiers and confirmed the operative site. Anesthesia was induced by the anesthesia team. The operative site was marked, hair was clipped with surgical clippers, the area was then prepped and draped in a sterile fashion. Fluoroscopy was used to localize the surgical level and mark the incision. A linear incision was placed in the cervical midline. Subperiosteal dissection was performed to expose the C4-C6 lamina and lateral masses. C4-C6 laminectomies were performed with a high speed drill. The spinal cord was clearly swollen at herniating posteriorly out of the canal after the laminectomy. The operating microscope was brought into the field and a midline durotomy was performed and tumor was immediately encountered. The intraspinal component was dissected free from the  spinal cord and nerve roots. Dorsal nerve roots appeared to enter the tumor without a clear plane. These were stimulated and no EMG signal was obtained so they were divided and the intraspinal portion of the tumor was mobilized and removed.   A left C5-6 foraminotomy was then performed with a high speed drill. A transverse dural incision was performed over the root sleeve and tumor was debulked after absence of stimulation. The left C6 nerve root was traced from the midline and tumor was dissected off. Tumor was also dissected off of the left vertebral artery. The tumor in these locations was very adherent and these complex dissections took a significant amount of time to perform safely. The visible portions of the foraminal component of the tumor were removed and the C6 nerve root was stimulating well proximally and distally with stable amperage as before dissection. At this point, repeat MEPs began to show an improvement over baseline, which was reassuring.  Hemostasis was obtained and the midline durotomy was closed in a running fashion. Duragen allograft was used as a patch graft over the transverse durotomy then adheris (Stryker) fibrin glue was applied with no evidence of CSF leakage.   Given the complete facetectomy required for foraminal tumor dissection, an instrumented fusion was performed. 41mm lateral mass screws (Medtronic) were placed bilaterally at C4, C5, and C6. Rods were placed with locking caps and then all six caps were final tightened with a torque wrench. The lateral masses were decorticated, the previously removed lamina were morselized and the autograft was applied as a fusion mass.    Hemostasis was again confirmed and the incision was closed in layers in a water tight fashion. The tumor  specimen was sent to pathology for analysis. Final repeat SSEPs/MEPs showed an improvement over the patient's pre-flip baseline.The patient was then returned to anesthesia for emergence. No apparent  complications at the completion of the procedure.   EBL:  780mL   DRAINS: none   SPECIMENS: Cervical nerve sheath tumor   Judith Part, MD 08/19/18 5:10 PM

## 2018-08-19 NOTE — Anesthesia Postprocedure Evaluation (Signed)
Anesthesia Post Note  Patient: Dennis Carrillo  Procedure(s) Performed: Cervical four to Cervical six posterior cervical laminectomy for intradural tumor, cervical four to cervical six instrumented fusion (N/A Neck)     Patient location during evaluation: PACU Anesthesia Type: General Level of consciousness: awake and alert Pain management: pain level controlled Vital Signs Assessment: post-procedure vital signs reviewed and stable Respiratory status: spontaneous breathing, nonlabored ventilation, respiratory function stable and patient connected to nasal cannula oxygen Cardiovascular status: blood pressure returned to baseline and stable Postop Assessment: no apparent nausea or vomiting Anesthetic complications: no    Last Vitals:  Vitals:   08/19/18 1753 08/19/18 1810  BP: 128/84 115/78  Pulse: 78 77  Resp: 14 11  Temp:    SpO2: 98% 98%    Last Pain:  Vitals:   08/19/18 1724  TempSrc:   PainSc: [name]

## 2018-08-19 NOTE — H&P (Signed)
Surgical H&P Update  HPI: 32 y.o. man with LUE weakness and progressive myelopathy, MRI showed a cervical nerve sheath tumor, here for surgical resection. No changes in health since he was last seen, today he is having worsening radicular pain into the left arm and hand.   PMHx:  Past Medical History:  Diagnosis Date  . Arthritis    FamHx: History reviewed. No pertinent family history. SocHx:  reports that he has never smoked. He has never used smokeless tobacco. He reports that he drank alcohol. He reports that he does not use drugs.  Physical Exam: AOx3, PERRL, FS, TM  Strength diffusely 4+/5 in LUE except biceps 4/5, strength otherwise 5/5, SILT except for diffuse L hand numbness +hoffman's, +ankle clonus  Assesment/Plan: 32 y.o. man with cervical nerve sheath tumor, here for surgical resection. Risks, benefits, and alternatives discussed and the patient would like to continue with surgery. -OR today -4N post-op  Judith Part, MD 08/19/18 7:20 AM

## 2018-08-20 ENCOUNTER — Inpatient Hospital Stay (HOSPITAL_COMMUNITY): Payer: Managed Care, Other (non HMO)

## 2018-08-20 ENCOUNTER — Encounter (HOSPITAL_COMMUNITY): Payer: Self-pay | Admitting: Neurological Surgery

## 2018-08-20 MED ORDER — DIPHENHYDRAMINE HCL 50 MG/ML IJ SOLN: 12.5000 mg | Freq: Four times a day (QID) | INTRAMUSCULAR | Status: DC | PRN

## 2018-08-20 MED ORDER — NALOXONE HCL 0.4 MG/ML IJ SOLN
0.4000 mg | INTRAMUSCULAR | Status: DC | PRN
  Administered 2018-08-22: 0.4 mg via INTRAVENOUS
  Filled 2018-08-20: qty 1

## 2018-08-20 MED ORDER — SODIUM CHLORIDE 0.9% FLUSH
9.0000 mL | INTRAVENOUS | Status: DC | PRN
  Administered 2018-08-22: 9 mL via INTRAVENOUS
  Filled 2018-08-20: qty 9

## 2018-08-20 MED ORDER — DIPHENHYDRAMINE HCL 12.5 MG/5ML PO ELIX
12.5000 mg | ORAL_SOLUTION | Freq: Four times a day (QID) | ORAL | Status: DC | PRN
  Administered 2018-08-20 – 2018-08-23 (×2): 12.5 mg via ORAL
  Filled 2018-08-20 (×3): qty 5

## 2018-08-20 MED ORDER — HYDROMORPHONE 1 MG/ML IV SOLN
INTRAVENOUS | Status: DC
  Administered 2018-08-20: 2.3 mg via INTRAVENOUS
  Administered 2018-08-20: 25 mg via INTRAVENOUS
  Administered 2018-08-21: 5.4 mg via INTRAVENOUS
  Administered 2018-08-21 (×2): 2.4 mg via INTRAVENOUS
  Administered 2018-08-21: 3 mg via INTRAVENOUS
  Administered 2018-08-21: 1.8 mg via INTRAVENOUS
  Administered 2018-08-21: 5.4 mg via INTRAVENOUS
  Administered 2018-08-21: 25 mg via INTRAVENOUS
  Administered 2018-08-22: 22:00:00 via INTRAVENOUS
  Administered 2018-08-22: 5.1 mg via INTRAVENOUS
  Administered 2018-08-22: 3.9 mg via INTRAVENOUS
  Administered 2018-08-22: 4.2 mg via INTRAVENOUS
  Administered 2018-08-23: 5.1 mg via INTRAVENOUS
  Administered 2018-08-23: 2.7 mg via INTRAVENOUS
  Administered 2018-08-23: 3.3 mg via INTRAVENOUS
  Administered 2018-08-24: 0.3 mg via INTRAVENOUS
  Administered 2018-08-24 (×2): 0 mg via INTRAVENOUS
  Filled 2018-08-20 (×4): qty 25

## 2018-08-20 MED ORDER — GADOBUTROL 1 MMOL/ML IV SOLN
7.5000 mL | Freq: Once | INTRAVENOUS | Status: AC | PRN
  Administered 2018-08-20: 7.5 mL via INTRAVENOUS

## 2018-08-20 MED ORDER — CYCLOBENZAPRINE HCL 10 MG PO TABS
10.0000 mg | ORAL_TABLET | Freq: Three times a day (TID) | ORAL | Status: DC | PRN
  Administered 2018-08-20 – 2018-08-24 (×8): 10 mg via ORAL
  Filled 2018-08-20 (×8): qty 1

## 2018-08-20 NOTE — Progress Notes (Signed)
Occupational Therapy Evaluation Patient Details Name: Dennis Carrillo MRN: [id number] DOB: 1985/10/23 Today's Date: 08/20/2018    History of Present Illness 32 yo admitted for cervical tumor resection with LUE weakness s/p C4-6 lami with fusion. No significant PMHx   Clinical Impression   PTA, pt independent with ADL and mobility, worked for [employer], is married and has 3 children (8,6 & 4). Pt currently requires mod A for ADL and appears primarily limited by pain. Pt complains of numbness in L index & middle fingers and thumb. Strength overall WFL.Pt appears to be experiencing muscle spasms, limiting mobility and functional activity. Recommend alternating heat/ice to upper traps q 30 min to reduce pain. Nsg notified. At this time, recommend follow up with outpt OT to facilitate return to independence with occupational roles. Will follow acutely.     Follow Up Recommendations  Outpatient OT;Supervision - Intermittent    Equipment Recommendations  None recommended by OT    Recommendations for Other Services       Precautions / Restrictions Precautions Precautions: Fall;Cervical Restrictions Weight Bearing Restrictions: No      Mobility Bed Mobility               General bed mobility comments: OOB in chair  Transfers Overall transfer level: Needs assistance   Transfers: Sit to/from Stand Sit to Stand: Min assist - steady assist         General transfer comment: pt appears fearful of pain; not using arms to help with stabilizing; rigid posture    Balance Overall balance assessment: Needs assistance   Sitting balance-Leahy Scale: Good       Standing balance-Leahy Scale: Good                             ADL either performed or assessed with clinical judgement   ADL Overall ADL's : Needs assistance/impaired Eating/Feeding: Set up;Sitting   Grooming: Moderate assistance;Standing   Upper Body Bathing: Moderate  assistance;Sitting   Lower Body Bathing: Moderate assistance;Sit to/from stand   Upper Body Dressing : Maximal assistance;Sitting   Lower Body Dressing: Maximal assistance;Sit to/from stand   Toilet Transfer: Minimal assistance;Ambulation           Functional mobility during ADLs: Minimal assistance(steady assist) General ADL Comments: limited by pain; will likely progress     Vision Baseline Vision/History: No visual deficits       Perception     Praxis      Pertinent Vitals/Pain Pain Assessment: Faces Faces Pain Scale: Hurts whole lot Pain Location: left shoulder and bil arms Pain Descriptors / Indicators: Aching;Constant;Throbbing;Burning;Cramping;Stabbing Pain Intervention(s): Limited activity within patient's tolerance;RN gave pain meds during session;Heat applied;Relaxation(heat appliedupper traps away from incision)     Hand Dominance Right   Extremity/Trunk Assessment Upper Extremity Assessment Upper Extremity Assessment: LUE deficits/detail;RUE deficits/detail RUE Deficits / Details: limited shoulder ROM due to apparent pain; sensation appeasr WNL RUE: Unable to fully assess due to pain RUE Sensation: WNL RUE Coordination: decreased gross motor LUE Deficits / Details: limited by shoulder pain; decreased sensation in L index, middle and thumb; reports improvement since surgery; staes his hand is "clumsy" LUE: Unable to fully assess due to pain LUE Sensation: decreased light touch   Lower Extremity Assessment Lower Extremity Assessment: Defer to PT evaluation   Cervical / Trunk Assessment Cervical / Trunk Assessment: Other exceptions Cervical / Trunk Exceptions: very stiff posture   Communication Communication Communication: No difficulties;Prefers language other  than English(primary language Spanish but speaks [place])   Cognition Arousal/Alertness: Awake/alert Behavior During Therapy: WFL for tasks assessed/performed Overall Cognitive Status: Within  Functional Limits for tasks assessed                                     General Comments       Exercises Exercises: Other exercises Other Exercises Other Exercises: gentle shoulder rolls as toelrated to help release tension Other Exercises: BUE supported on 2 pillows   Shoulder Instructions      Home Living Family/patient expects to be discharged to:: Private residence Living Arrangements: Spouse/significant other;Children Available Help at Discharge: Family;Available 24 hours/day Type of Home: House Home Access: Level entry     Home Layout: One level     Bathroom Shower/Tub: Teacher, early years/pre: Standard Bathroom Accessibility: No   Home Equipment: None          Prior Functioning/Environment Level of Independence: Independent        Comments: works for a [employer] Problem List: Decreased strength;Decreased range of motion;Decreased coordination;Decreased knowledge of use of DME or AE;Decreased knowledge of precautions;Pain;Impaired UE functional use      OT Treatment/Interventions: Self-care/ADL training;Therapeutic exercise;DME and/or AE instruction;Therapeutic activities;Patient/family education    OT Goals(Current goals can be found in the care plan section) Acute Rehab OT Goals Patient Stated Goal: return to work and taking my kids to the park OT Goal Formulation: With patient Time For Goal Achievement: 09/03/18 Potential to Achieve Goals: Good  OT Frequency: Min 3X/week   Barriers to D/C:            Co-evaluation              AM-PAC PT "6 Clicks" Daily Activity     Outcome Measure Help from another person eating meals?: A Little Help from another person taking care of personal grooming?: A Lot Help from another person toileting, which includes using toliet, bedpan, or urinal?: A Little Help from another person bathing (including washing, rinsing, drying)?: A Lot Help from another person to  put on and taking off regular upper body clothing?: A Lot Help from another person to put on and taking off regular lower body clothing?: A Lot 6 Click Score: 14   End of Session Equipment Utilized During Treatment: Gait belt Nurse Communication: Patient requests pain meds  Activity Tolerance: Patient tolerated treatment well Patient left: in chair;with call bell/phone within reach;with family/visitor present  OT Visit Diagnosis: Pain;Muscle weakness (generalized) (M62.81) Pain - Right/Left: Left(right) Pain - part of body: Shoulder;Arm                Time: 9211-9417 OT Time Calculation (min): 36 min Charges:  OT General Charges $OT Visit: 1 Visit OT Evaluation $OT Eval Moderate Complexity: 1 Mod OT Treatments $Self Care/Home Management : 8-22 mins  Maurie Boettcher, OT/L   Acute OT Clinical Specialist Acute Rehabilitation Services Pager [phone] Office [phone]   [facility] 08/20/2018, 2:34 PM

## 2018-08-20 NOTE — Evaluation (Signed)
Physical Therapy Evaluation Patient Details Name: Dennis Carrillo MRN: [id number] DOB: 10/29/1985 Today's Date: 08/20/2018   History of Present Illness  32 yo admitted for cervical tumor resection with LUE weakness s/p C4-6 lami with fusion. No significant PMHx  Clinical Impression  Pt works at [employer], has 3 kids (7,6,4) and wants to return to independent function. Pt states he was limited by pain rather than weakness prior to admission and continues to be very guarded and limited in movement due to pain. Pt with pain 5/10 at rest, increased to 10/10 with transition to sitting and 8/10 after gait. Pt with decreased transfers, gait, balance and function who will benefit from acute therapy to maximize mobility and independence adhering to precautions to decrease burden of care.     Follow Up Recommendations Home health PT(pending progression)    Equipment Recommendations  None recommended by PT    Recommendations for Other Services OT consult     Precautions / Restrictions Precautions Precautions: Fall;Cervical Precaution Booklet Issued: Yes (comment)      Mobility  Bed Mobility Overal bed mobility: Needs Assistance Bed Mobility: Rolling;Sidelying to Sit Rolling: Mod assist Sidelying to sit: Mod assist       General bed mobility comments: assist to elevate trunk from surface, cues and assist to sequence rolling to right. Max assist to scoot fully to EOB with reciprocal scooting and increased time. Pt very cautious due to pain with all movement  Transfers Overall transfer level: Needs assistance   Transfers: Sit to/from Stand Sit to Stand: Min assist         General transfer comment: min assist with increased time and cues to stand from bed and lower to chair  Ambulation/Gait Ambulation/Gait assistance: Min assist Gait Distance (Feet): 150 Feet Assistive device: None;1 person hand held assist Gait Pattern/deviations: Step-through pattern;Decreased  stride length   Gait velocity interpretation: <1.8 ft/sec, indicate of risk for recurrent falls General Gait Details: pt very stiff, elevating shoulder, no arm swing, tendency for posterior bias with cues for breathing, relaxation, anterior translation with min assist for balance with and without UE support throughout gait  Stairs            Wheelchair Mobility    Modified Rankin (Stroke Patients Only)       Balance Overall balance assessment: Needs assistance   Sitting balance-Leahy Scale: Good       Standing balance-Leahy Scale: Good                               Pertinent Vitals/Pain Pain Assessment: 0-10 Pain Score: 8  Pain Location: left shoulder and bil arms Pain Descriptors / Indicators: Aching;Constant;Throbbing Pain Intervention(s): Limited activity within patient's tolerance;Repositioned;Monitored during session;Premedicated before session;RN gave pain meds during session    [place] expects to be discharged to:: Private residence Living Arrangements: Spouse/significant other;Children Available Help at Discharge: Family;Available 24 hours/day Type of Home: House Home Access: Level entry     Home Layout: One level Home Equipment: None      Prior Function Level of Independence: Independent         Comments: works for a Geophysicist/field seismologist Dominance   Dominant Hand: Right    Extremity/Trunk Assessment   Upper Extremity Assessment Upper Extremity Assessment: Defer to OT evaluation    Lower Extremity Assessment Lower Extremity Assessment: Overall WFL for tasks assessed    Cervical / Trunk Assessment  Cervical / Trunk Assessment: Other exceptions Cervical / Trunk Exceptions: pt with very stiff posture, neck and trunk, forward head, post surgical neck  Communication   Communication: No difficulties(primary language Spanish but speaks [place])  Cognition Arousal/Alertness: Awake/alert Behavior During  Therapy: WFL for tasks assessed/performed Overall Cognitive Status: Within Functional Limits for tasks assessed                                        General Comments      Exercises     Assessment/Plan    PT Assessment Patient needs continued PT services  PT Problem List Decreased mobility;Decreased strength;Decreased range of motion;Decreased activity tolerance;Pain;Decreased balance;Decreased knowledge of use of DME       PT Treatment Interventions Gait training;Therapeutic activities;Therapeutic exercise;Neuromuscular re-education;DME instruction;Functional mobility training;Balance training;Patient/family education    PT Goals (Current goals can be found in the Care Plan section)  Acute Rehab PT Goals Patient Stated Goal: return to work and taking my kids to the park PT Goal Formulation: With patient/family Time For Goal Achievement: 09/03/18 Potential to Achieve Goals: Good    Frequency Min 5X/week   Barriers to discharge        Co-evaluation               AM-PAC PT "6 Clicks" Daily Activity  Outcome Measure Difficulty turning over in bed (including adjusting bedclothes, sheets and blankets)?: Unable Difficulty moving from lying on back to sitting on the side of the bed? : Unable Difficulty sitting down on and standing up from a chair with arms (e.g., wheelchair, bedside commode, etc,.)?: Unable Help needed moving to and from a bed to chair (including a wheelchair)?: A Little Help needed walking in hospital room?: A Little Help needed climbing 3-5 steps with a railing? : A Lot 6 Click Score: 11    End of Session Equipment Utilized During Treatment: Gait belt Activity Tolerance: Patient tolerated treatment well Patient left: in chair;with call bell/phone within reach;with family/visitor present;with nursing/sitter in room Nurse Communication: Mobility status;Precautions PT Visit Diagnosis: Other abnormalities of gait and mobility  (R26.89);Unsteadiness on feet (R26.81);Other symptoms and signs involving the nervous system (R29.898)    Time: 3570-1779 PT Time Calculation (min) (ACUTE ONLY): 30 min   Charges:   PT Evaluation $PT Eval Moderate Complexity: 1 Mod PT Treatments $Gait Training: 8-22 mins        Jaicey Sweaney Pam Drown, PT Acute Rehabilitation Services Pager: [phone] Office: [place] 08/20/2018, 8:29 AM

## 2018-08-20 NOTE — Progress Notes (Signed)
Report given, care assumed by Maretta Bees, RN.

## 2018-08-20 NOTE — Progress Notes (Signed)
Neurosurgery Service Progress Note  Subjective: No acute events overnight. Neck stiffness but otherwise no complaints   Objective: Vitals:   08/20/18 0400 08/20/18 0500 08/20/18 0700 08/20/18 0730  BP: 121/79 122/83 122/85 130/89  Pulse: (!) 104 100 93 (!) 107  Resp: 12 20 11 17   Temp:    98.6 F (37 C)  TempSrc:    Oral  SpO2: 98% 98% 98% 96%  Weight:      Height:       Temp (24hrs), Avg:98.7 F (37.1 C), Min:97.3 F (36.3 C), Max:100.4 F (38 C)  CBC Latest Ref Rng & Units 08/07/2018 11/02/2008 11/02/2008  WBC 4.0 - 10.5 K/uL 8.0 - 13.6(H)  Hemoglobin 13.0 - 17.0 g/dL 16.3 16.7 15.9  Hematocrit 39.0 - 52.0 % 49.6 49.0 47.1  Platelets 150 - 400 K/uL 232 - 198   BMP Latest Ref Rng & Units 11/02/2008  Glucose 70 - 99 mg/dL 89  BUN 6 - 23 mg/dL 10  Creatinine 0.4 - 1.5 mg/dL 1.1  Sodium 135 - 145 mEq/L 140  Potassium 3.5 - 5.1 mEq/L 3.9  Chloride 96 - 112 mEq/L 102    Intake/Output Summary (Last 24 hours) at 08/20/2018 0747 Last data filed at 08/20/2018 0500 Gross per 24 hour  Intake 3703 ml  Output 4000 ml  Net -297 ml    Current Facility-Administered Medications:  .  0.9 %  sodium chloride infusion, 250 mL, Intravenous, Continuous, Franco Duley, Joyice Faster, MD, Stopped at 08/20/18 0740 .  acetaminophen (TYLENOL) tablet 650 mg, 650 mg, Oral, Q4H PRN, 650 mg at 08/20/18 0730 **OR** acetaminophen (TYLENOL) suppository 650 mg, 650 mg, Rectal, Q4H PRN, Aleeha Boline A, MD .  dexamethasone (DECADRON) injection 4 mg, 4 mg, Intravenous, Q8H **OR** dexamethasone (DECADRON) tablet 4 mg, 4 mg, Oral, Q8H, Abisai Coble A, MD, 4 mg at 08/20/18 0543 .  docusate sodium (COLACE) capsule 100 mg, 100 mg, Oral, BID, Dekota Shenk, Joyice Faster, MD, 100 mg at 08/20/18 0730 .  HYDROmorphone (DILAUDID) injection 0.5 mg, 0.5 mg, Intravenous, Q3H PRN, Judith Part, MD, 0.5 mg at 08/20/18 0543 .  menthol-cetylpyridinium (CEPACOL) lozenge 3 mg, 1 lozenge, Oral, PRN, 3 mg at 08/19/18 2021  **OR** phenol (CHLORASEPTIC) mouth spray 1 spray, 1 spray, Mouth/Throat, PRN, Aleksander Edmiston A, MD .  ondansetron (ZOFRAN) tablet 4 mg, 4 mg, Oral, Q6H PRN **OR** ondansetron (ZOFRAN) injection 4 mg, 4 mg, Intravenous, Q6H PRN, Agripina Guyette A, MD .  oxyCODONE (Oxy IR/ROXICODONE) immediate release tablet 10 mg, 10 mg, Oral, Q4H PRN, Judith Part, MD, 10 mg at 08/20/18 0730 .  oxyCODONE (Oxy IR/ROXICODONE) immediate release tablet 5 mg, 5 mg, Oral, Q4H PRN, Elizabethann Lackey A, MD .  polyethylene glycol (MIRALAX / GLYCOLAX) packet 17 g, 17 g, Oral, Daily PRN, Judith Part, MD, 17 g at 08/20/18 0730 .  sodium chloride flush (NS) 0.9 % injection 3 mL, 3 mL, Intravenous, Q12H, Emaleigh Guimond, Joyice Faster, MD, 3 mL at 08/19/18 2205 .  sodium chloride flush (NS) 0.9 % injection 3 mL, 3 mL, Intravenous, PRN, Judith Part, MD   Physical Exam: AOx3, PERRL, EOMI, FS, TM, Strength 5/5 in BLE / RUE; LUE 4+/5 except 4/5 in bicep, 3/5 in deltoid  Assessment & Plan: 32 y.o. man s/p removal of large cervical schwannoma, recovering well. 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

## 2018-08-20 NOTE — Progress Notes (Signed)
Called Dr Zada Finders re: pain management. Patient taking prn pain meds around the clock, receiving muscle relaxant, and using heat/ice therapy, yet c/o severe pain, limiting patient's ability to participate in lung exercises and limiting mobility. Dr Zada Finders stated he will order PCA.

## 2018-08-21 LAB — POCT I-STAT 4, (NA,K, GLUC, HGB,HCT)
GLUCOSE: 113 mg/dL — AB (ref 70–99)
Glucose, Bld: 107 mg/dL — ABNORMAL HIGH (ref 70–99)
HEMATOCRIT: 37 % — AB (ref 39.0–52.0)
HEMATOCRIT: 36 % — AB (ref 39.0–52.0)
Hemoglobin: 12.6 g/dL — ABNORMAL LOW (ref 13.0–17.0)
Hemoglobin: 12.2 g/dL — ABNORMAL LOW (ref 13.0–17.0)
POTASSIUM: 3.9 mmol/L (ref 3.5–5.1)
Potassium: 3.6 mmol/L (ref 3.5–5.1)
Sodium: 143 mmol/L (ref 135–145)
Sodium: 142 mmol/L (ref 135–145)

## 2018-08-21 LAB — POCT I-STAT 7, (LYTES, BLD GAS, ICA,H+H)
Bicarbonate: 25 mmol/L (ref 20.0–28.0)
CALCIUM ION: 1.19 mmol/L (ref 1.15–1.40)
HEMATOCRIT: 38 % — AB (ref 39.0–52.0)
HEMOGLOBIN: 12.9 g/dL — AB (ref 13.0–17.0)
O2 SAT: 100 %
PH ART: 7.377 (ref 7.350–7.450)
Patient temperature: 37
Potassium: 3.8 mmol/L (ref 3.5–5.1)
SODIUM: 139 mmol/L (ref 135–145)
TCO2: 26 mmol/L (ref 22–32)
pCO2 arterial: 42.6 mmHg (ref 32.0–48.0)
pO2, Arterial: 235 mmHg — ABNORMAL HIGH (ref 83.0–108.0)

## 2018-08-21 MED ORDER — METHOCARBAMOL 1000 MG/10ML IJ SOLN
500.0000 mg | Freq: Four times a day (QID) | INTRAVENOUS | Status: DC | PRN
  Administered 2018-08-21: 500 mg via INTRAVENOUS
  Filled 2018-08-21: qty 500

## 2018-08-21 MED ORDER — DEXAMETHASONE SODIUM PHOSPHATE 4 MG/ML IJ SOLN
4.0000 mg | Freq: Once | INTRAMUSCULAR | Status: AC
  Administered 2018-08-21: 4 mg via INTRAVENOUS
  Filled 2018-08-21: qty 1

## 2018-08-21 MED ORDER — TAMSULOSIN HCL 0.4 MG PO CAPS
0.4000 mg | ORAL_CAPSULE | Freq: Every day | ORAL | Status: DC
  Administered 2018-08-21 – 2018-08-26 (×6): 0.4 mg via ORAL
  Filled 2018-08-21 (×6): qty 1

## 2018-08-21 MED ORDER — HEPARIN SODIUM (PORCINE) 5000 UNIT/ML IJ SOLN
5000.0000 [IU] | Freq: Three times a day (TID) | INTRAMUSCULAR | Status: DC
  Administered 2018-08-21 – 2018-08-26 (×16): 5000 [IU] via SUBCUTANEOUS
  Filled 2018-08-21 (×16): qty 1

## 2018-08-21 MED ORDER — GABAPENTIN 300 MG PO CAPS
300.0000 mg | ORAL_CAPSULE | Freq: Three times a day (TID) | ORAL | Status: DC
  Administered 2018-08-21 – 2018-08-26 (×16): 300 mg via ORAL
  Filled 2018-08-21 (×16): qty 1

## 2018-08-21 NOTE — Progress Notes (Signed)
Neurosurgery Service Progress Note  Subjective: No acute events overnight. Still having significant neck pain when awake but otherwise resting comfortably. Was able to ambulate yesterday and strength is improving  Objective: Vitals:   08/21/18 0400 08/21/18 0500 08/21/18 0600 08/21/18 0700  BP: 126/81 127/82 127/84 116/84  Pulse: (!) 112 (!) 104 93 87  Resp: (!) 8 13 15 13   Temp: 99.1 F (37.3 C)     TempSrc: Oral     SpO2: 93% 94% 94% 95%  Weight:      Height:       Temp (24hrs), Avg:99 F (37.2 C), Min:98.5 F (36.9 C), Max:99.2 F (37.3 C)  CBC Latest Ref Rng & Units 08/19/2018 08/19/2018 08/19/2018  WBC 4.0 - 10.5 K/uL - - -  Hemoglobin 13.0 - 17.0 g/dL 12.2(L) 12.6(L) 12.9(L)  Hematocrit 39.0 - 52.0 % 36.0(L) 37.0(L) 38.0(L)  Platelets 150 - 400 K/uL - - -   BMP Latest Ref Rng & Units 08/19/2018 08/19/2018 08/19/2018  Glucose 70 - 99 mg/dL 113(H) 107(H) -  BUN 6 - 23 mg/dL - - -  Creatinine 0.4 - 1.5 mg/dL - - -  Sodium 135 - 145 mmol/L 142 143 139  Potassium 3.5 - 5.1 mmol/L 3.9 3.6 3.8  Chloride 96 - 112 mEq/L - - -    Intake/Output Summary (Last 24 hours) at 08/21/2018 0746 Last data filed at 08/21/2018 0600 Gross per 24 hour  Intake 78 ml  Output 1225 ml  Net -1147 ml    Current Facility-Administered Medications:  .  0.9 %  sodium chloride infusion, 250 mL, Intravenous, Continuous, Lucienne Sawyers A, MD, Last Rate: 1 mL/hr at 08/20/18 1900, 250 mL at 08/20/18 1900 .  acetaminophen (TYLENOL) tablet 650 mg, 650 mg, Oral, Q4H PRN, 650 mg at 08/20/18 1530 **OR** acetaminophen (TYLENOL) suppository 650 mg, 650 mg, Rectal, Q4H PRN, Judith Part, MD .  cyclobenzaprine (FLEXERIL) tablet 10 mg, 10 mg, Oral, TID PRN, Judith Part, MD, 10 mg at 08/21/18 0317 .  dexamethasone (DECADRON) injection 4 mg, 4 mg, Intravenous, Once, Rick Warnick A, MD .  diphenhydrAMINE (BENADRYL) injection 12.5 mg, 12.5 mg, Intravenous, Q6H PRN **OR** diphenhydrAMINE  (BENADRYL) 12.5 MG/5ML elixir 12.5 mg, 12.5 mg, Oral, Q6H PRN, Judith Part, MD, 12.5 mg at 08/20/18 2057 .  docusate sodium (COLACE) capsule 100 mg, 100 mg, Oral, BID, Judith Part, MD, 100 mg at 08/20/18 2100 .  gabapentin (NEURONTIN) tablet 300 mg, 300 mg, Oral, TID, Gailya Tauer A, MD .  HYDROmorphone (DILAUDID) 1 mg/mL PCA injection, , Intravenous, Q4H, Meda Dudzinski, Joyice Faster, MD, 25 mg at 08/20/18 1738 .  menthol-cetylpyridinium (CEPACOL) lozenge 3 mg, 1 lozenge, Oral, PRN, 3 mg at 08/20/18 1535 **OR** phenol (CHLORASEPTIC) mouth spray 1 spray, 1 spray, Mouth/Throat, PRN, Alontae Chaloux, Joyice Faster, MD .  methocarbamol (ROBAXIN) 500 mg in dextrose 5 % 50 mL IVPB, 500 mg, Intravenous, Q6H PRN, Costella, Vista Mink, PA-C, Stopped at 08/21/18 0546 .  naloxone (NARCAN) injection 0.4 mg, 0.4 mg, Intravenous, PRN **AND** sodium chloride flush (NS) 0.9 % injection 9 mL, 9 mL, Intravenous, PRN, Ashonti Leandro A, MD .  ondansetron (ZOFRAN) tablet 4 mg, 4 mg, Oral, Q6H PRN **OR** ondansetron (ZOFRAN) injection 4 mg, 4 mg, Intravenous, Q6H PRN, Kassidi Elza A, MD .  polyethylene glycol (MIRALAX / GLYCOLAX) packet 17 g, 17 g, Oral, Daily PRN, Judith Part, MD, 17 g at 08/20/18 0730 .  sodium chloride flush (NS) 0.9 % injection 3 mL, 3  mL, Intravenous, Q12H, Judith Part, MD, 3 mL at 08/20/18 2200 .  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 -continue PCA -added gabapentin -dex x1 for synergy in pain relief -cont antispasmodics -cont PT/OT -SCDs/TEDs, start [place]  08/21/18 7:46 AM

## 2018-08-21 NOTE — Progress Notes (Signed)
Report received from Towana Badger, RN. Awaiting pt to transfer to 4E27.

## 2018-08-21 NOTE — Progress Notes (Signed)
Patient was transferred to 4E27 with Dilaudid PCA still running. 23.38ml in the syringe. Dual RN verification Delilah M RN.

## 2018-08-21 NOTE — Progress Notes (Signed)
Pt transfer to 4E27 on dilaudid PCA, 2 LNC, currently in recliner with spouse at chair-side. Pt alert/oriented x 4. Care assumed at this time.

## 2018-08-21 NOTE — Progress Notes (Signed)
Occupational Therapy Treatment Patient Details Name: Dennis Carrillo MRN: [id number] DOB: 06-24-1986 Today's Date: 08/21/2018    History of present illness 32 yo admitted for cervical tumor resection with LUE weakness s/p C4-6 lami with fusion. No significant PMHx   OT comments  Pt continues to move in a guarded position although increased BUE movement as compared to yesterday. Focus of session on ADL retraining regarding cervical precautions. O2 Sats during ADL greater than 90 on RA. He in 140s while trying to urinate.  Cervical precautions provided in spanish. Pt continues to complain of L hand numbness. Will continue to follow acutely.   Follow Up Recommendations  Outpatient OT;Supervision - Intermittent    Equipment Recommendations  None recommended by OT    Recommendations for Other Services      Precautions / Restrictions Precautions Precautions: Fall;Cervical       Mobility Bed Mobility               General bed mobility comments: OOB in chair  Transfers Overall transfer level: Needs assistance   Transfers: Sit to/from Stand Sit to Stand: Min guard         General transfer comment: vc to use arms during mobility    Balance Overall balance assessment: Needs assistance   Sitting balance-Leahy Scale: Good       Standing balance-Leahy Scale: Good                             ADL either performed or assessed with clinical judgement   ADL Overall ADL's : Needs assistance/impaired     Grooming: Set up;Supervision/safety;Standing                   Toilet Transfer: Min guard   Toileting- Clothing Manipulation and Hygiene: Min guard       Functional mobility during ADLs: Min guard General ADL Comments: Educated pt/wife on proper technique for grooming; pt not wanting to move; educted pt that he needs to move BUE; Wife attempting to step in and assist. Educated wife on need to let pt do as much of his ADL as possible      Manufacturing systems engineer      Cognition Arousal/Alertness: Awake/alert Behavior During Therapy: Flat affect Overall Cognitive Status: Within Functional Limits for tasks assessed                                          Exercises Other Exercises Other Exercises: gentle shoulder rolls as toelrated to help release tension   Shoulder Instructions       General Comments      Pertinent Vitals/ Pain       Pain Assessment: Faces Pain Score: 8  Faces Pain Scale: Hurts whole lot Pain Location: neck and shoulders Pain Descriptors / Indicators: Aching;Cramping;Grimacing;Guarding Pain Intervention(s): Limited activity within patient's tolerance  Home Living                                          Prior Functioning/Environment              Frequency  Min 3X/week        Progress Toward Goals  OT Goals(current goals  can now be found in the care plan section)  Progress towards OT goals: Progressing toward goals  Acute Rehab OT Goals Patient Stated Goal: return to work and taking my kids to the park OT Goal Formulation: With patient Time For Goal Achievement: 09/03/18 Potential to Achieve Goals: Good ADL Goals Pt Will Perform Upper Body Bathing: with modified independence;sitting Pt Will Perform Lower Body Bathing: with modified independence;sit to/from stand;with adaptive equipment Pt Will Perform Upper Body Dressing: with modified independence;sitting Pt Will Perform Lower Body Dressing: with modified independence;sit to/from stand Pt/caregiver will Perform Home Exercise Program: Both right and left upper extremity;Increased ROM;Independently;With written HEP provided Additional ADL Goal #1: Pt/wife will independently verbalize 3 cervical precautions  Plan Discharge plan remains appropriate    Co-evaluation                 AM-PAC PT "6 Clicks" Daily Activity     Outcome Measure   Help from another  person eating meals?: None Help from another person taking care of personal grooming?: A Little Help from another person toileting, which includes using toliet, bedpan, or urinal?: A Little Help from another person bathing (including washing, rinsing, drying)?: A Lot Help from another person to put on and taking off regular upper body clothing?: A Lot Help from another person to put on and taking off regular lower body clothing?: A Lot 6 Click Score: 16    End of Session    OT Visit Diagnosis: Pain;Muscle weakness (generalized) (M62.81) Pain - Right/Left: Left Pain - part of body: Shoulder;Arm   Activity Tolerance Patient tolerated treatment well   Patient Left in chair;with call bell/phone within reach;with family/visitor present   Nurse Communication Patient requests pain meds;Mobility status        Time: 1100-1125 OT Time Calculation (min): 25 min  Charges: OT General Charges $OT Visit: 1 Visit OT Treatments $Self Care/Home Management : 23-37 mins  Maurie Boettcher, OT/L   Acute OT Clinical Specialist Donna Pager [phone] Office [phone]    [facility] 08/21/2018, 12:36 PM

## 2018-08-21 NOTE — Progress Notes (Signed)
Pt still complaining of increasing pain in shoulders, even with PCA and other prns. Notified Vinny, [place]- order IV Robaxin prn. Will continue to monitor.

## 2018-08-21 NOTE — Progress Notes (Signed)
Physical Therapy Treatment Patient Details Name: Dennis Carrillo MRN: [id number] DOB: 03/14/1986 Today's Date: 08/21/2018    History of Present Illness 32 yo admitted for cervical tumor resection with LUE weakness s/p C4-6 lami with fusion. No significant PMHx    PT Comments    Pt remains stiff, fearful of pain, cautious with all mobility and reports constant 8/10 pain despite premedication with muscle relaxor, PCA, and steroids. Pt educated for IS use and encouraged use but reported increased posterior neck pain to 10/10 after utilizing IS to 2500. Pt educated for UE function and movement and encouraged normalization of activity with use of bil UE for transfers and feeding. Will continue to follow and progress mobility with gait encouraged with staff.   SpO2 92% on RA with gait dropping to 88% end of session and returned to 1L with SpO2 93% HR 121 with gait, 98 end of session   Follow Up Recommendations  Outpatient PT     Equipment Recommendations  None recommended by PT    Recommendations for Other Services       Precautions / Restrictions Precautions Precautions: Fall;Cervical    Mobility  Bed Mobility               General bed mobility comments: OOB in chair, slept in chair and denied returning to bed  Transfers Overall transfer level: Needs assistance   Transfers: Sit to/from Stand Sit to Stand: Min assist         General transfer comment: cues for using arms for transfers, increased time to scoot forward to edge of chair, min assist to rise from surface  Ambulation/Gait Ambulation/Gait assistance: Min guard Gait Distance (Feet): 300 Feet Assistive device: None Gait Pattern/deviations: Step-through pattern;Decreased stride length   Gait velocity interpretation: 1.31 - 2.62 ft/sec, indicative of limited community ambulator General Gait Details: pt remains, stiff and guarded with gait. Pt without arms swing or trunk rotation but able to  maintain balance without physical assist and increased gait tolerance. Cues for breathing technique, shoulder depression and relaxation throughout session.    Stairs             Wheelchair Mobility    Modified Rankin (Stroke Patients Only)       Balance Overall balance assessment: Needs assistance   Sitting balance-Leahy Scale: Good       Standing balance-Leahy Scale: Good                              Cognition Arousal/Alertness: Awake/alert Behavior During Therapy: Flat affect Overall Cognitive Status: Within Functional Limits for tasks assessed                                        Exercises      General Comments        Pertinent Vitals/Pain Pain Score: 8  Pain Location: neck and bil shoulders. 10/10 at rest, 8/10 with gait Pain Descriptors / Indicators: Aching;Constant;Burning;Cramping Pain Intervention(s): Limited activity within patient's tolerance;Repositioned;PCA encouraged;Monitored during session;Relaxation;Premedicated before session    Home Living                      Prior Function            PT Goals (current goals can now be found in the care plan section) Progress towards PT goals: Progressing toward goals  Frequency    Min 5X/week      PT Plan Discharge plan needs to be updated    Co-evaluation              AM-PAC PT "6 Clicks" Daily Activity  Outcome Measure  Difficulty turning over in bed (including adjusting bedclothes, sheets and blankets)?: Unable Difficulty moving from lying on back to sitting on the side of the bed? : Unable Difficulty sitting down on and standing up from a chair with arms (e.g., wheelchair, bedside commode, etc,.)?: Unable Help needed moving to and from a bed to chair (including a wheelchair)?: A Little Help needed walking in hospital room?: A Little Help needed climbing 3-5 steps with a railing? : A Lot 6 Click Score: 11    End of Session Equipment  Utilized During Treatment: Gait belt Activity Tolerance: Patient tolerated treatment well;Patient limited by pain Patient left: in chair;with call bell/phone within reach;with family/visitor present;with nursing/sitter in room Nurse Communication: Mobility status;Precautions PT Visit Diagnosis: Other abnormalities of gait and mobility (R26.89);Unsteadiness on feet (R26.81);Other symptoms and signs involving the nervous system (N35.670)     Time: 1410-3013 PT Time Calculation (min) (ACUTE ONLY): 26 min  Charges:  $Gait Training: 8-22 mins $Therapeutic Activity: 8-22 mins                     [place], PT Acute Rehabilitation Services Pager: [phone] Office: [place] 08/21/2018, 9:45 AM

## 2018-08-22 NOTE — Progress Notes (Addendum)
Pt became very drowsy and respirations were down to 8 breaths per minute. PCA was locked out for over use. In the past four hours, there had been 8 demands with 5 deliveries of the Dilaudid PCA. To reverse Dilaudid effects, narcan 0.4 mg was given IV. Pt now alert and resp at 22. Paged on call neuro surgeon for any other pain medicine available for pt.  Will continue to monitor.  Lupita Dawn, RN

## 2018-08-22 NOTE — Progress Notes (Signed)
SW received consult for patient for SNF placement, however PT and OT have evaluated patient and made no recommendations for services after discharge.   If additional needs or concerns arise, please consult CSW.  Madilyn Fireman, MSW Clinical Social Worker [place]

## 2018-08-22 NOTE — Progress Notes (Signed)
Wasted 1.5 mLs dilaudid PCA syringe into stericycle w/ Jairo Ben, RN sitnessing

## 2018-08-22 NOTE — Progress Notes (Signed)
PT Cancellation Note  Patient Details Name: Dennis Carrillo MRN: [id number] DOB: 06/11/1986   Cancelled Treatment:    Reason Eval/Treat Not Completed: Pain limiting ability to participate.  Has been off pain pump this afternoon and PT attempted to see.  However, the pump had not been reconnected yet and pt reports he is in too much pain to walk.  Has been up with nursing earlier.  Will try again at another time.   Ramond Dial 08/22/2018, 3:42 PM   Mee Hives, PT MS Acute Rehab Dept. Number: [place] and [place]

## 2018-08-22 NOTE — Progress Notes (Signed)
Neurosurgery Service Progress Note  Subjective: Overnight concern for opiate toxicity, resolved with naloxone. Still complaining of a lot of neck pain, no radicular pain, still very stiff with movements   Objective: Vitals:   08/22/18 0450 08/22/18 0451 08/22/18 0800 08/22/18 1159  BP: 131/88   137/88  Pulse: 68   68  Resp: 16 11 10 13   Temp: 98.3 F (36.8 C)   97.8 F (36.6 C)  TempSrc: Oral   Oral  SpO2: 96% 95% 97% 97%  Weight:      Height:       Temp (24hrs), Avg:98.4 F (36.9 C), Min:97.8 F (36.6 C), Max:99 F (37.2 C)  CBC Latest Ref Rng & Units 08/19/2018 08/19/2018 08/19/2018  WBC 4.0 - 10.5 K/uL - - -  Hemoglobin 13.0 - 17.0 g/dL 12.2(L) 12.6(L) 12.9(L)  Hematocrit 39.0 - 52.0 % 36.0(L) 37.0(L) 38.0(L)  Platelets 150 - 400 K/uL - - -   BMP Latest Ref Rng & Units 08/19/2018 08/19/2018 08/19/2018  Glucose 70 - 99 mg/dL 113(H) 107(H) -  BUN 6 - 23 mg/dL - - -  Creatinine 0.4 - 1.5 mg/dL - - -  Sodium 135 - 145 mmol/L 142 143 139  Potassium 3.5 - 5.1 mmol/L 3.9 3.6 3.8  Chloride 96 - 112 mEq/L - - -    Intake/Output Summary (Last 24 hours) at 08/22/2018 1347 Last data filed at 08/22/2018 0840 Gross per 24 hour  Intake 128 ml  Output 1425 ml  Net -1297 ml    Current Facility-Administered Medications:  .  0.9 %  sodium chloride infusion, 250 mL, Intravenous, Continuous, Jocsan Mcginley A, MD, Last Rate: 1 mL/hr at 08/20/18 1900, 250 mL at 08/20/18 1900 .  acetaminophen (TYLENOL) tablet 650 mg, 650 mg, Oral, Q4H PRN, 650 mg at 08/22/18 1212 **OR** acetaminophen (TYLENOL) suppository 650 mg, 650 mg, Rectal, Q4H PRN, Judith Part, MD .  cyclobenzaprine (FLEXERIL) tablet 10 mg, 10 mg, Oral, TID PRN, Judith Part, MD, 10 mg at 08/22/18 0020 .  diphenhydrAMINE (BENADRYL) injection 12.5 mg, 12.5 mg, Intravenous, Q6H PRN **OR** diphenhydrAMINE (BENADRYL) 12.5 MG/5ML elixir 12.5 mg, 12.5 mg, Oral, Q6H PRN, Judith Part, MD, 12.5 mg at 08/20/18  2057 .  docusate sodium (COLACE) capsule 100 mg, 100 mg, Oral, BID, Niguel Moure, Joyice Faster, MD, 100 mg at 08/22/18 1051 .  gabapentin (NEURONTIN) capsule 300 mg, 300 mg, Oral, TID, Hoang Pettingill A, MD, 300 mg at 08/22/18 1051 .  heparin injection 5,000 Units, 5,000 Units, Subcutaneous, Q8H, Judith Part, MD, 5,000 Units at 08/22/18 [id number] .  HYDROmorphone (DILAUDID) 1 mg/mL PCA injection, , Intravenous, Q4H, Jarris Kortz, Joyice Faster, MD, 25 mg at 08/21/18 2028 .  menthol-cetylpyridinium (CEPACOL) lozenge 3 mg, 1 lozenge, Oral, PRN, 3 mg at 08/20/18 1535 **OR** phenol (CHLORASEPTIC) mouth spray 1 spray, 1 spray, Mouth/Throat, PRN, Yesena Reaves, Joyice Faster, MD .  methocarbamol (ROBAXIN) 500 mg in dextrose 5 % 50 mL IVPB, 500 mg, Intravenous, Q6H PRN, Costella, Vista Mink, PA-C, Stopped at 08/21/18 0546 .  naloxone [facility]) injection 0.4 mg, 0.4 mg, Intravenous, PRN, 0.4 mg at 08/22/18 0240 **AND** sodium chloride flush (NS) 0.9 % injection 9 mL, 9 mL, Intravenous, PRN, Judith Part, MD, 9 mL at 08/22/18 0241 .  ondansetron (ZOFRAN) tablet 4 mg, 4 mg, Oral, Q6H PRN **OR** ondansetron (ZOFRAN) injection 4 mg, 4 mg, Intravenous, Q6H PRN, Clotile Whittington A, MD .  polyethylene glycol (MIRALAX / GLYCOLAX) packet 17 g, 17 g, Oral, Daily PRN, [place],  Joyice Faster, MD, 17 g at 08/20/18 0730 .  sodium chloride flush (NS) 0.9 % injection 3 mL, 3 mL, Intravenous, Q12H, Janeene Sand, Joyice Faster, MD, 3 mL at 08/21/18 2249 .  sodium chloride flush (NS) 0.9 % injection 3 mL, 3 mL, Intravenous, PRN, Judith Part, MD .  tamsulosin (FLOMAX) capsule 0.4 mg, 0.4 mg, Oral, Daily, Nikiyah Fackler A, MD, 0.4 mg at 08/22/18 1051   Physical Exam: AOx3, PERRL, EOMI, FS, TM, Strength 5/5 x4 except 4/5 in L delt and 4+/5 in L bicep Incision c/d/i  Assessment & Plan: 32 y.o. 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 -continue PCA, will transition off PCA  tomorrow -added gabapentin -cont antispasmodics -cont PT/OT -SCDs/TEDs, SQH  Wendelin Reader A [facility] [place]  08/22/18 1:47 PM

## 2018-08-23 LAB — GLUCOSE, CAPILLARY: Glucose-Capillary: 94 mg/dL (ref 70–99)

## 2018-08-23 NOTE — Progress Notes (Signed)
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 No significant PMHx    PT Comments    Patient seen for activity progression. During ambulation, patient with some noted instability, required 3 standing rest breaks and notably lethargic and diaphoretic nsg notified. Educated family at bedside regarding only needing to use PCA for active pain as she was waking him up and telling him to hit the button. Nsg also notified of this interaction.  Follow Up Recommendations  Outpatient PT     Equipment Recommendations  None recommended by PT    Recommendations for Other Services OT consult     Precautions / Restrictions Precautions Precautions: Fall;Cervical Precaution Booklet Issued: Yes (comment) Restrictions Weight Bearing Restrictions: No    Mobility  Bed Mobility               General bed mobility comments: OOB in chair  Transfers Overall transfer level: Needs assistance   Transfers: Sit to/from Stand Sit to Stand: Min assist         General transfer comment: Min assist for stability  Ambulation/Gait Ambulation/Gait assistance: Min assist Gait Distance (Feet): 200 Feet Assistive device: 1 person hand held assist Gait Pattern/deviations: Step-through pattern;Decreased stride length   Gait velocity interpretation: 1.31 - 2.62 ft/sec, indicative of limited community ambulator General Gait Details: patient with some noted instability, required 3 standing rest breaks and notably lethargic and diaphoretic nsg notified   Stairs             Wheelchair Mobility    Modified Rankin (Stroke Patients Only)       Balance Overall balance assessment: Needs assistance   Sitting balance-Leahy Scale: Good       Standing balance-Leahy Scale: Poor Standing  balance comment: reliance on UE support                            Cognition Arousal/Alertness: Awake/alert Behavior During Therapy: Flat affect Overall Cognitive Status: Within Functional Limits for tasks assessed                                        Exercises      General Comments        Pertinent Vitals/Pain Faces Pain Scale: Hurts whole lot Pain Location: neck and shoulders Pain Descriptors / Indicators: Aching;Cramping;Grimacing;Guarding    Home Living                      Prior Function            PT Goals (current goals can now be found in the care plan section) Acute Rehab PT Goals Patient Stated Goal: return to work and taking my kids to the park PT Goal Formulation: With patient/family Time For Goal Achievement: 09/03/18 Potential to Achieve Goals: Good Progress towards PT goals: Not progressing toward goals - comment(lethargic and diaphoretic)    Frequency    Min 5X/week      PT Plan Discharge plan needs to be updated    Co-evaluation              AM-PAC PT "6 Clicks" Daily Activity  Outcome Measure  Difficulty turning over in bed (including adjusting bedclothes,  sheets and blankets)?: Unable Difficulty moving from lying on back to sitting on the side of the bed? : Unable Difficulty sitting down on and standing up from a chair with arms (e.g., wheelchair, bedside commode, etc,.)?: Unable Help needed moving to and from a bed to chair (including a wheelchair)?: A Little Help needed walking in hospital room?: A Little Help needed climbing 3-5 steps with a railing? : A Lot 6 Click Score: 11    End of Session Equipment Utilized During Treatment: Gait belt Activity Tolerance: Patient tolerated treatment well;Patient limited by pain Patient left: in chair;with call bell/phone within reach;with family/visitor present Nurse Communication: Mobility status;Precautions PT Visit Diagnosis: Other abnormalities of  gait and mobility (R26.89);Unsteadiness on feet (R26.81);Other symptoms and signs involving the nervous system (R29.898)     Time: 8786-7672 PT Time Calculation (min) (ACUTE ONLY): 22 min  Charges:  $Gait Training: 8-22 mins                     Alben Deeds, PT DPT  Board Certified Neurologic Specialist [place] Pager [phone] Office [place] 08/23/2018, 3:24 PM

## 2018-08-23 NOTE — Progress Notes (Addendum)
Neurosurgery Service Progress Note  Subjective: No issues overnight, feels his pain is slightly better today   Objective: Vitals:   08/22/18 2000 08/22/18 2325 08/23/18 0332 08/23/18 0725  BP:  98/84 132/78   Pulse:  69 72   Resp: 11 16 17 12   Temp:  98.2 F (36.8 C) 98.1 F (36.7 C)   TempSrc:  Oral Oral   SpO2: 98% 95% 98% 96%  Weight:      Height:       Temp (24hrs), Avg:98.1 F (36.7 C), Min:97.8 F (36.6 C), Max:98.6 F (37 C)  CBC Latest Ref Rng & Units 08/19/2018 08/19/2018 08/19/2018  WBC 4.0 - 10.5 K/uL - - -  Hemoglobin 13.0 - 17.0 g/dL 12.2(L) 12.6(L) 12.9(L)  Hematocrit 39.0 - 52.0 % 36.0(L) 37.0(L) 38.0(L)  Platelets 150 - 400 K/uL - - -   BMP Latest Ref Rng & Units 08/19/2018 08/19/2018 08/19/2018  Glucose 70 - 99 mg/dL 113(H) 107(H) -  BUN 6 - 23 mg/dL - - -  Creatinine 0.4 - 1.5 mg/dL - - -  Sodium 135 - 145 mmol/L 142 143 139  Potassium 3.5 - 5.1 mmol/L 3.9 3.6 3.8  Chloride 96 - 112 mEq/L - - -    Intake/Output Summary (Last 24 hours) at 08/23/2018 1057 Last data filed at 08/23/2018 0300 Gross per 24 hour  Intake 204.5 ml  Output 1450 ml  Net -1245.5 ml    Current Facility-Administered Medications:  .  0.9 %  sodium chloride infusion, 250 mL, Intravenous, Continuous, Fareeda Downard A, MD, Last Rate: 1 mL/hr at 08/20/18 1900, 250 mL at 08/20/18 1900 .  acetaminophen (TYLENOL) tablet 650 mg, 650 mg, Oral, Q4H PRN, 650 mg at 08/22/18 1944 **OR** acetaminophen (TYLENOL) suppository 650 mg, 650 mg, Rectal, Q4H PRN, Judith Part, MD .  cyclobenzaprine (FLEXERIL) tablet 10 mg, 10 mg, Oral, TID PRN, Judith Part, MD, 10 mg at 08/22/18 0020 .  diphenhydrAMINE (BENADRYL) injection 12.5 mg, 12.5 mg, Intravenous, Q6H PRN **OR** diphenhydrAMINE (BENADRYL) 12.5 MG/5ML elixir 12.5 mg, 12.5 mg, Oral, Q6H PRN, Judith Part, MD, 12.5 mg at 08/23/18 0324 .  docusate sodium (COLACE) capsule 100 mg, 100 mg, Oral, BID, Judith Part, MD,  100 mg at 08/23/18 0944 .  gabapentin (NEURONTIN) capsule 300 mg, 300 mg, Oral, TID, Judith Part, MD, 300 mg at 08/23/18 0944 .  heparin injection 5,000 Units, 5,000 Units, Subcutaneous, Q8H, Judith Part, MD, 5,000 Units at 08/23/18 [phone] .  HYDROmorphone (DILAUDID) 1 mg/mL PCA injection, , Intravenous, Q4H, Marvis Saefong A, MD .  menthol-cetylpyridinium (CEPACOL) lozenge 3 mg, 1 lozenge, Oral, PRN, 3 mg at 08/20/18 1535 **OR** phenol (CHLORASEPTIC) mouth spray 1 spray, 1 spray, Mouth/Throat, PRN, Sylvan Sookdeo, Joyice Faster, MD .  methocarbamol (ROBAXIN) 500 mg in dextrose 5 % 50 mL IVPB, 500 mg, Intravenous, Q6H PRN, Costella, Vista Mink, PA-C, Stopped at 08/21/18 0546 .  naloxone [facility]) injection 0.4 mg, 0.4 mg, Intravenous, PRN, 0.4 mg at 08/22/18 0240 **AND** sodium chloride flush (NS) 0.9 % injection 9 mL, 9 mL, Intravenous, PRN, Judith Part, MD, 9 mL at 08/22/18 0241 .  ondansetron (ZOFRAN) tablet 4 mg, 4 mg, Oral, Q6H PRN **OR** ondansetron (ZOFRAN) injection 4 mg, 4 mg, Intravenous, Q6H PRN, Hinley Brimage A, MD .  polyethylene glycol (MIRALAX / GLYCOLAX) packet 17 g, 17 g, Oral, Daily PRN, Judith Part, MD, 17 g at 08/23/18 0944 .  sodium chloride flush (NS) 0.9 % injection 3 mL,  3 mL, Intravenous, Q12H, Judith Part, MD, 3 mL at 08/21/18 2249 .  sodium chloride flush (NS) 0.9 % injection 3 mL, 3 mL, Intravenous, PRN, Judith Part, MD .  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 -continue PCA, likely transition tomorrow. Pt became diaphoretic while trying to do strength testing, it does appear that he has significant pain -cont gabapentin &  antispasmodics -post-op urinary retention requiring foley replacement 11/16, cont flomax, will try to discontinue foley 11/19 -cont PT/OT -SCDs/TEDs, Bufford Lope  Meygan Kyser A Lota Leamer  08/23/18 10:57 AM

## 2018-08-24 LAB — BASIC METABOLIC PANEL
Anion gap: 10 (ref 5–15)
BUN: 8 mg/dL (ref 6–20)
CO2: 25 mmol/L (ref 22–32)
CREATININE: 0.74 mg/dL (ref 0.61–1.24)
Calcium: 9.1 mg/dL (ref 8.9–10.3)
Chloride: 102 mmol/L (ref 98–111)
GFR calc Af Amer: >60 mL/min (ref >60)
GFR calc non Af Amer: >60 mL/min (ref >60)
GLUCOSE: 109 mg/dL — AB (ref 70–99)
Potassium: 3.5 mmol/L (ref 3.5–5.1)
Sodium: 137 mmol/L (ref 135–145)

## 2018-08-24 LAB — CBC
HEMATOCRIT: 40.8 % (ref 39.0–52.0)
HEMOGLOBIN: 13.9 g/dL (ref 13.0–17.0)
MCH: 28.2 pg (ref 26.0–34.0)
MCHC: 34.1 g/dL (ref 30.0–36.0)
MCV: 82.8 fL (ref 80.0–100.0)
Platelets: 342 10*3/uL (ref 150–400)
RBC: 4.93 MIL/uL (ref 4.22–5.81)
RDW: 11.9 % (ref 11.5–15.5)
WBC: 9.7 10*3/uL (ref 4.0–10.5)
nRBC: 0 % (ref 0.0–0.2)

## 2018-08-24 LAB — BRAIN NATRIURETIC PEPTIDE: B Natriuretic Peptide: 8.3 pg/mL (ref 0.0–100.0)

## 2018-08-24 MED ORDER — METOPROLOL TARTRATE 25 MG PO TABS: 25.0000 mg | ORAL_TABLET | Freq: Once | ORAL | Status: DC

## 2018-08-24 MED ORDER — OXYCODONE HCL 5 MG PO TABS
10.0000 mg | ORAL_TABLET | ORAL | Status: DC | PRN
  Administered 2018-08-24 – 2018-08-25 (×5): 10 mg via ORAL
  Filled 2018-08-24 (×6): qty 2

## 2018-08-24 MED ORDER — SODIUM CHLORIDE 0.9 % IV BOLUS
500.0000 mL | Freq: Once | INTRAVENOUS | Status: AC
  Administered 2018-08-24: 500 mL via INTRAVENOUS

## 2018-08-24 MED ORDER — LIDOCAINE 5 % EX PTCH
2.0000 | MEDICATED_PATCH | CUTANEOUS | Status: DC
  Administered 2018-08-24 – 2018-08-25 (×2): 2 via TRANSDERMAL
  Filled 2018-08-24 (×2): qty 2

## 2018-08-24 MED ORDER — METOPROLOL TARTRATE 12.5 MG HALF TABLET
12.5000 mg | ORAL_TABLET | Freq: Once | ORAL | Status: AC
  Administered 2018-08-24: 12.5 mg via ORAL
  Filled 2018-08-24: qty 1

## 2018-08-24 MED ORDER — OXYCODONE HCL 5 MG PO TABS
5.0000 mg | ORAL_TABLET | ORAL | Status: DC | PRN
  Administered 2018-08-24 – 2018-08-26 (×2): 5 mg via ORAL
  Filled 2018-08-24: qty 1

## 2018-08-24 MED ORDER — HYDROMORPHONE HCL 1 MG/ML IJ SOLN: 0.5000 mg | INTRAMUSCULAR | Status: DC | PRN

## 2018-08-24 MED ORDER — DIAZEPAM 5 MG PO TABS
10.0000 mg | ORAL_TABLET | Freq: Four times a day (QID) | ORAL | Status: DC | PRN
  Administered 2018-08-24: 10 mg via ORAL
  Filled 2018-08-24 (×2): qty 2

## 2018-08-24 NOTE — Progress Notes (Signed)
PCA d/c per order. 10mg  oxycodone administered and new pain regime explained to pt and wife. 9mg  of dilaudid wasted in Stericycle with Teacher, adult education.  Clyde Canterbury, RN

## 2018-08-24 NOTE — Progress Notes (Signed)
Pt with tachycardia sustaining in 130's. BP 115/91 (100). MD paged, ordered received and placed. EKG obtained. Will continue to monitor.  Clyde Canterbury, RN

## 2018-08-24 NOTE — Progress Notes (Signed)
Pt HR sustaining in 130-160's after returning to bed from BR. BP 114/81 (94).  MD paged, orders received. Will continue to monitor.  Clyde Canterbury, RN

## 2018-08-24 NOTE — Plan of Care (Signed)
  Problem: Health Behavior/Discharge Planning: Goal: Ability to manage health-related needs will improve Outcome: Progressing   Problem: Clinical Measurements: Goal: Ability to maintain clinical measurements within normal limits will improve Outcome: Progressing Goal: Will remain free from infection Outcome: Progressing Goal: Respiratory complications will improve Outcome: Progressing   

## 2018-08-24 NOTE — Progress Notes (Signed)
Physical Therapy Treatment Patient Details Name: Dennis Carrillo MRN: [id number] DOB: Apr 11, 1986 Today's Date: 08/24/2018    History of Present Illness 32 yo admitted for cervical tumor resection with LUE weakness s/p C4-6 lami with fusion. No significant PMHx    PT Comments    Patient seen for mobility progression - per nursing patient with elevated HR all day, however cleared PT to ambulate with patient. Patient with HR at ~130 at rest and up to 162 with activity; patient diaphoretic throughout with nursing notified. Patient requiring light Min A throughout due to instability. PT to continue to follow.    Follow Up Recommendations  Outpatient PT     Equipment Recommendations  None recommended by PT    Recommendations for Other Services OT consult     Precautions / Restrictions Precautions Precautions: Fall;Cervical Restrictions Weight Bearing Restrictions: No    Mobility  Bed Mobility               General bed mobility comments: OOB in chair  Transfers Overall transfer level: Needs assistance   Transfers: Sit to/from Stand Sit to Stand: Min assist         General transfer comment: Min A for positioning and powering up into stand  Ambulation/Gait Ambulation/Gait assistance: Min assist;Min guard;+2 safety/equipment(chair follow) Gait Distance (Feet): 450 Feet Assistive device: Rolling walker (2 wheeled) Gait Pattern/deviations: Step-through pattern;Decreased stride length     General Gait Details: slow pace of gait; mild unsteadiness with patient visibly swaying in static stance; HR up to 162 with gait   Stairs             Wheelchair Mobility    Modified Rankin (Stroke Patients Only)       Balance Overall balance assessment: Needs assistance Sitting-balance support: No upper extremity supported;Feet supported Sitting balance-Leahy Scale: Good     Standing balance support: Bilateral upper extremity supported;During functional  activity Standing balance-Leahy Scale: Poor Standing balance comment: reliance on UE support                            Cognition Arousal/Alertness: Awake/alert Behavior During Therapy: Flat affect Overall Cognitive Status: Within Functional Limits for tasks assessed                                        Exercises      General Comments        Pertinent Vitals/Pain Pain Assessment: 0-10 Pain Score: 8  Pain Location: neck and shoulders Pain Descriptors / Indicators: Aching;Cramping;Grimacing;Guarding Pain Intervention(s): Limited activity within patient's tolerance;Monitored during session;Premedicated before session;Repositioned    Home Living                      Prior Function            PT Goals (current goals can now be found in the care plan section) Acute Rehab PT Goals Patient Stated Goal: return to work and taking my kids to the park PT Goal Formulation: With patient/family Time For Goal Achievement: 09/03/18 Potential to Achieve Goals: Good Progress towards PT goals: Progressing toward goals    Frequency    Min 5X/week      PT Plan Current plan remains appropriate    Co-evaluation PT/OT/SLP Co-Evaluation/Treatment: Yes Reason for Co-Treatment: For patient/therapist safety;To address functional/ADL transfers PT goals addressed during session: Mobility/safety with mobility;Balance;Proper  use of DME        AM-PAC PT "6 Clicks" Daily Activity  Outcome Measure  Difficulty turning over in bed (including adjusting bedclothes, sheets and blankets)?: Unable Difficulty moving from lying on back to sitting on the side of the bed? : Unable Difficulty sitting down on and standing up from a chair with arms (e.g., wheelchair, bedside commode, etc,.)?: Unable Help needed moving to and from a bed to chair (including a wheelchair)?: A Little Help needed walking in hospital room?: A Little Help needed climbing 3-5 steps with  a railing? : A Little 6 Click Score: 12    End of Session Equipment Utilized During Treatment: Gait belt Activity Tolerance: Patient tolerated treatment well;Patient limited by pain Patient left: in chair;with call bell/phone within reach;with family/visitor present Nurse Communication: Mobility status;Precautions PT Visit Diagnosis: Other abnormalities of gait and mobility (R26.89);Unsteadiness on feet (R26.81);Other symptoms and signs involving the nervous system (Z61.096)     Time: 0454-0981 PT Time Calculation (min) (ACUTE ONLY): 31 min  Charges:  $Gait Training: 8-22 mins                     Lanney Gins, PT, DPT Supplemental Physical Therapist 08/24/18 4:44 PM Pager: [phone] Office: [phone]

## 2018-08-24 NOTE — Progress Notes (Signed)
Occupational Therapy Treatment Patient Details Name: Dennis Carrillo MRN: [id number] DOB: January 26, 1986 Today's Date: 08/24/2018    History of present illness 32 yo admitted for cervical tumor resection with LUE weakness s/p C4-6 lami with fusion. No significant PMHx   OT comments  Pt progressing towards established OT goals. Pt highly motivated and wife very supportive. Providing education on compensatory techniques for UB and LB dressing and pt will require further practice. Pt requiring Min A and RW for functional mobility. HR elevating to 150s during mobility and RN aware. Continue to recommend follow up at OP OT and will continue to follow acutely as admitted.   Follow Up Recommendations  Outpatient OT;Supervision - Intermittent    Equipment Recommendations  None recommended by OT    Recommendations for Other Services      Precautions / Restrictions Precautions Precautions: Fall;Cervical Precaution Comments: Reviewed cervical precautions' Required Braces or Orthoses: Other Brace/Splint Other Brace/Splint: No brace per MD order Restrictions Weight Bearing Restrictions: No       Mobility Bed Mobility               General bed mobility comments: OOB in chair  Transfers Overall transfer level: Needs assistance   Transfers: Sit to/from Stand Sit to Stand: Min assist         General transfer comment: Min A for positioning and powering up into stand    Balance Overall balance assessment: Needs assistance Sitting-balance support: No upper extremity supported;Feet supported Sitting balance-Leahy Scale: Good     Standing balance support: Bilateral upper extremity supported;During functional activity Standing balance-Leahy Scale: Poor Standing balance comment: reliance on UE support                           ADL either performed or assessed with clinical judgement   ADL Overall ADL's : Needs assistance/impaired                    Upper Body Dressing Details (indicate cue type and reason): Educating pt on UB dressing and cervical precautions. Will need practice Lower Body Dressing: Sit to/from stand;Moderate assistance Lower Body Dressing Details (indicate cue type and reason): Educating pt on LB dressing technqiues. Pt able to bring ankles to tops of shins, but unable to complete figure four position.  Toilet Transfer: Minimal assistance;Ambulation;RW(simulated to recliner)           Functional mobility during ADLs: Minimal assistance;Rolling walker General ADL Comments: Pt highly motivated and wife very supportive     Vision       Perception     Praxis      Cognition Arousal/Alertness: Awake/alert Behavior During Therapy: Flat affect Overall Cognitive Status: Within Functional Limits for tasks assessed                                 General Comments: Motivated        Exercises     Shoulder Instructions       General Comments HR elevated and RN aware. Resting HR at 133 adn elevatring to 150s during activity    Pertinent Vitals/ Pain       Pain Assessment: 0-10 Pain Score: 8  Pain Location: neck and shoulders Pain Descriptors / Indicators: Aching;Cramping;Grimacing;Guarding Pain Intervention(s): Limited activity within patient's tolerance;Monitored during session;Repositioned  Home Living  Prior Functioning/Environment              Frequency  Min 3X/week        Progress Toward Goals  OT Goals(current goals can now be found in the care plan section)  Progress towards OT goals: Progressing toward goals  Acute Rehab OT Goals Patient Stated Goal: return to work and taking my kids to the park OT Goal Formulation: With patient Time For Goal Achievement: 09/03/18 Potential to Achieve Goals: Good ADL Goals Pt Will Perform Upper Body Bathing: with modified independence;sitting Pt Will Perform Lower Body  Bathing: with modified independence;sit to/from stand;with adaptive equipment Pt Will Perform Upper Body Dressing: with modified independence;sitting Pt Will Perform Lower Body Dressing: with modified independence;sit to/from stand Pt/caregiver will Perform Home Exercise Program: Both right and left upper extremity;Increased ROM;Independently;With written HEP provided Additional ADL Goal #1: Pt/wife will independently verbalize 3 cervical precautions  Plan Discharge plan remains appropriate    Co-evaluation    PT/OT/SLP Co-Evaluation/Treatment: Yes Reason for Co-Treatment: Complexity of the patient's impairments (multi-system involvement);For patient/therapist safety;To address functional/ADL transfers PT goals addressed during session: Mobility/safety with mobility;Balance;Proper use of DME OT goals addressed during session: ADL's and self-care      AM-PAC PT "6 Clicks" Daily Activity     Outcome Measure   Help from another person eating meals?: None Help from another person taking care of personal grooming?: A Little Help from another person toileting, which includes using toliet, bedpan, or urinal?: A Little Help from another person bathing (including washing, rinsing, drying)?: A Lot Help from another person to put on and taking off regular upper body clothing?: A Lot Help from another person to put on and taking off regular lower body clothing?: A Lot 6 Click Score: 16    End of Session Equipment Utilized During Treatment: Gait belt;Rolling walker  OT Visit Diagnosis: Pain;Muscle weakness (generalized) (M62.81) Pain - Right/Left: Left Pain - part of body: Shoulder;Arm   Activity Tolerance Patient tolerated treatment well   Patient Left in chair;with call bell/phone within reach;with family/visitor present   Nurse Communication Mobility status;Other (comment)(HR)        Time: 3976-7341 OT Time Calculation (min): 31 min  Charges: OT General Charges $OT Visit: 1  Visit OT Treatments $Self Care/Home Management : 8-22 mins  IXL, OTR/L Acute Rehab Pager: [phone] Office: [place] 08/24/2018, 5:15 PM

## 2018-08-24 NOTE — Progress Notes (Signed)
Neurosurgery Service Progress Note  Subjective: No issues overnight, pt states pain is the same, but seems like now he's only symptomatic in the trapezius bilaterally  Objective: Vitals:   08/24/18 0844 08/24/18 0845 08/24/18 1249 08/24/18 1258  BP: 128/80  114/81   Pulse:   (!) 147   Resp:  14 17 20   Temp:  98.7 F (37.1 C) 98.8 F (37.1 C)   TempSrc:  Oral Oral   SpO2:  98% 95% 95%  Weight:      Height:       Temp (24hrs), Avg:98.5 F (36.9 C), Min:98 F (36.7 C), Max:98.8 F (37.1 C)  CBC Latest Ref Rng & Units 08/19/2018 08/19/2018 08/19/2018  WBC 4.0 - 10.5 K/uL - - -  Hemoglobin 13.0 - 17.0 g/dL 12.2(L) 12.6(L) 12.9(L)  Hematocrit 39.0 - 52.0 % 36.0(L) 37.0(L) 38.0(L)  Platelets 150 - 400 K/uL - - -   BMP Latest Ref Rng & Units 08/19/2018 08/19/2018 08/19/2018  Glucose 70 - 99 mg/dL 113(H) 107(H) -  BUN 6 - 23 mg/dL - - -  Creatinine 0.4 - 1.5 mg/dL - - -  Sodium 135 - 145 mmol/L 142 143 139  Potassium 3.5 - 5.1 mmol/L 3.9 3.6 3.8  Chloride 96 - 112 mEq/L - - -    Intake/Output Summary (Last 24 hours) at 08/24/2018 1422 Last data filed at 08/24/2018 0354 Gross per 24 hour  Intake 200 ml  Output 1500 ml  Net -1300 ml    Current Facility-Administered Medications:  .  0.9 %  sodium chloride infusion, 250 mL, Intravenous, Continuous, Vibhav Waddill A, MD, Last Rate: 1 mL/hr at 08/20/18 1900, 250 mL at 08/20/18 1900 .  acetaminophen (TYLENOL) tablet 650 mg, 650 mg, Oral, Q4H PRN, 650 mg at 08/24/18 1256 **OR** acetaminophen (TYLENOL) suppository 650 mg, 650 mg, Rectal, Q4H PRN, Judith Part, MD .  cyclobenzaprine (FLEXERIL) tablet 10 mg, 10 mg, Oral, TID PRN, Judith Part, MD, 10 mg at 08/24/18 1346 .  diphenhydrAMINE (BENADRYL) injection 12.5 mg, 12.5 mg, Intravenous, Q6H PRN **OR** diphenhydrAMINE (BENADRYL) 12.5 MG/5ML elixir 12.5 mg, 12.5 mg, Oral, Q6H PRN, Judith Part, MD, 12.5 mg at 08/23/18 0324 .  docusate sodium (COLACE) capsule  100 mg, 100 mg, Oral, BID, Judith Part, MD, 100 mg at 08/24/18 0846 .  gabapentin (NEURONTIN) capsule 300 mg, 300 mg, Oral, TID, Judith Part, MD, 300 mg at 08/24/18 0846 .  heparin injection 5,000 Units, 5,000 Units, Subcutaneous, Q8H, Judith Part, MD, 5,000 Units at 08/24/18 0521 .  HYDROmorphone (DILAUDID) 1 mg/mL PCA injection, , Intravenous, Q4H, Kimiko Common A, MD .  menthol-cetylpyridinium (CEPACOL) lozenge 3 mg, 1 lozenge, Oral, PRN, 3 mg at 08/20/18 1535 **OR** phenol (CHLORASEPTIC) mouth spray 1 spray, 1 spray, Mouth/Throat, PRN, Tanveer Brammer, Joyice Faster, MD .  methocarbamol (ROBAXIN) 500 mg in dextrose 5 % 50 mL IVPB, 500 mg, Intravenous, Q6H PRN, Costella, Vista Mink, PA-C, Stopped at 08/21/18 0546 .  naloxone [facility]) injection 0.4 mg, 0.4 mg, Intravenous, PRN, 0.4 mg at 08/22/18 0240 **AND** sodium chloride flush (NS) 0.9 % injection 9 mL, 9 mL, Intravenous, PRN, Judith Part, MD, 9 mL at 08/22/18 0241 .  ondansetron (ZOFRAN) tablet 4 mg, 4 mg, Oral, Q6H PRN **OR** ondansetron (ZOFRAN) injection 4 mg, 4 mg, Intravenous, Q6H PRN, Rosamund Nyland A, MD .  polyethylene glycol (MIRALAX / GLYCOLAX) packet 17 g, 17 g, Oral, Daily PRN, Judith Part, MD, 17 g at 08/24/18 1256 .  sodium chloride flush (NS) 0.9 % injection 3 mL, 3 mL, Intravenous, Q12H, Alynn Ellithorpe, Joyice Faster, MD, 3 mL at 08/21/18 2249 .  sodium chloride flush (NS) 0.9 % injection 3 mL, 3 mL, Intravenous, PRN, Judith Part, MD .  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 -d/c PCA, start oxy and hydromorph breakthrough -cont gabapentin -trapezius muscle tightness / pain - lidoderm  patches to traps bilaterally, have tried methocarbamol, cyclobenzaprine, will try diazepam now -post-op urinary retention: d/c foley, cont flomax  -cont PT/OT -SCDs/TEDs, [facility]  Annick Dimaio A Jazariah Teall  08/24/18 2:22 PM

## 2018-08-25 MED ORDER — MAGNESIUM CITRATE PO SOLN
1.0000 | Freq: Once | ORAL | Status: AC
  Administered 2018-08-25: 1 via ORAL
  Filled 2018-08-25: qty 296

## 2018-08-25 NOTE — Progress Notes (Signed)
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 No significant PMHx    PT Comments    Patient progressing with mobility but remains limited by elevated HR and pain. Patient with noted UE spasms with activity. Distracted by pain leading to instability with activity and ambulation. Current POC remains appropriate.    Follow Up Recommendations  Outpatient PT     Equipment Recommendations  None recommended by PT    Recommendations for Other Services OT consult     Precautions / Restrictions Precautions Precautions: Fall;Cervical Precaution Booklet Issued: Yes (comment)(spanish copy) Precaution Comments: Reviewed cervical precautions' Required Braces or Orthoses: Other Brace/Splint Other Brace/Splint: No brace per MD order Restrictions Weight Bearing Restrictions: No    Mobility  Bed Mobility Received OOB with OT  Transfers Overall transfer level: Needs assistance Equipment used: None Transfers: Sit to/from Stand Sit to Stand: Min assist         General transfer comment: Min assist for balance and stability when elevating to upright from chair and from [facility]. Cues for hand placement and positioning  Ambulation/Gait Ambulation/Gait assistance: Min guard;Min assist Gait Distance (Feet): 400 Feet Assistive device: Rolling walker (2 wheeled) Gait Pattern/deviations: Step-through pattern;Decreased stride length Gait velocity: decreased Gait velocity interpretation: <1.8 ft/sec, indicate of risk for recurrent falls General Gait Details: 2 noted moments of LOB requiring hand son physical assist for safety with mobility. OTherwise min guard with multi modal cues for pacing. 3 standing rest breaks due to UE spasms. Increased HR to 150s with activity.   Stairs              Wheelchair Mobility    Modified Rankin (Stroke Patients Only)       Balance Overall balance assessment: Needs assistance Sitting-balance support: No upper extremity supported;Feet supported Sitting balance-Leahy Scale: Good     Standing balance support: Bilateral upper extremity supported;During functional activity Standing balance-Leahy Scale: Fair Standing balance comment: Able to maintain static standing balance without UE support                            Cognition Arousal/Alertness: Awake/alert Behavior During Therapy: Flat affect Overall Cognitive Status: Within Functional Limits for tasks assessed                                 General Comments: Motivated      Exercises      General Comments General comments (skin integrity, edema, etc.): HR 120s to 150s with activity      Pertinent Vitals/Pain Pain Assessment: Faces Faces Pain Scale: Hurts even more Pain Location: neck and shoulders Pain Descriptors / Indicators: Aching;Cramping;Grimacing;Guarding Pain Intervention(s): Monitored during session;Limited activity within patient's tolerance;Repositioned    Home Living                      Prior Function            PT Goals (current goals can now be found in the care plan section) Acute Rehab PT Goals Patient Stated Goal: return to work and taking my kids to the park PT Goal Formulation: With patient/family Time For Goal Achievement: 09/03/18 Potential to Achieve Goals: Good Progress towards PT goals: Progressing toward goals  Frequency    Min 5X/week      PT Plan Current plan remains appropriate    Co-evaluation              AM-PAC PT "6 Clicks" Daily Activity  Outcome Measure  Difficulty turning over in bed (including adjusting bedclothes, sheets and blankets)?: Unable Difficulty moving from lying on back to sitting on the side of the bed? : Unable Difficulty sitting down on and standing up  from a chair with arms (e.g., wheelchair, bedside commode, etc,.)?: Unable Help needed moving to and from a bed to chair (including a wheelchair)?: A Little Help needed walking in hospital room?: A Little Help needed climbing 3-5 steps with a railing? : A Little 6 Click Score: 12    End of Session   Activity Tolerance: Patient tolerated treatment well;Patient limited by pain Patient left: in chair;with call bell/phone within reach;with family/visitor present Nurse Communication: Mobility status;Precautions PT Visit Diagnosis: Other abnormalities of gait and mobility (R26.89);Unsteadiness on feet (R26.81);Other symptoms and signs involving the nervous system (U88.280)     Time: 0349-1791 PT Time Calculation (min) (ACUTE ONLY): 20 min  Charges:  $Gait Training: 8-22 mins                     Alben Deeds, PT DPT  Board Certified Neurologic Specialist [place] Pager [phone] Office [phone]    Dennis Carrillo 08/25/2018, 2:00 PM

## 2018-08-25 NOTE — Plan of Care (Signed)
  Problem: Health Behavior/Discharge Planning: Goal: Ability to manage health-related needs will improve Outcome: Progressing   

## 2018-08-25 NOTE — Progress Notes (Signed)
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 No significant PMHx   OT comments  Pt progressing towards established OT goals. Continued education on compensatory techniques for dressing and adherence to cervical precautions. Pt performing UB dressing with Min Guard A and LB dressing with Min A. Pt's wife present throughout to increase carry over to home and provide assistance as needed. Will continue to follow acutely as admitted and continue to recommend dc home with follow up at OP OT.    Follow Up Recommendations  Outpatient OT;Supervision - Intermittent    Equipment Recommendations  None recommended by OT    Recommendations for Other Services      Precautions / Restrictions Precautions Precautions: Fall;Cervical Precaution Booklet Issued: Yes (comment)(spanish copy) Precaution Comments: Reviewed cervical precautions' Required Braces or Orthoses: Other Brace/Splint Other Brace/Splint: No brace per MD order Restrictions Weight Bearing Restrictions: No       Mobility Bed Mobility Overal bed mobility: Needs Assistance Bed Mobility: Rolling;Sidelying to Sit Rolling: Min assist Sidelying to sit: Min assist       General bed mobility comments: Min A to roll and requiring Max cues. Pt requiring Min A for elevating trunk  Transfers Overall transfer level: Needs assistance Equipment used: None Transfers: Sit to/from Stand Sit to Stand: Min assist         General transfer comment: Min A for balance and power up. Max cues for hand placement    Balance Overall balance assessment: Needs assistance Sitting-balance support: No upper extremity supported;Feet supported Sitting balance-Leahy Scale: Good     Standing balance support: Bilateral upper extremity  supported;During functional activity Standing balance-Leahy Scale: Fair Standing balance comment: Able to maintain static standing balance without UE support                           ADL either performed or assessed with clinical judgement   ADL Overall ADL's : Needs assistance/impaired                 Upper Body Dressing : Sitting;Min guard Upper Body Dressing Details (indicate cue type and reason): Min Guard A for safety and cues for compensatory techniques Lower Body Dressing: Minimal assistance;Sit to/from stand Lower Body Dressing Details (indicate cue type and reason): Min A for managing waist band. Educating on compensatory techniques for LB dressing. Pt able to bring ankles near knees Toilet Transfer: Minimal assistance;Ambulation;RW(simulated in room) Toilet Transfer Details (indicate cue type and reason): Min A for safety and balance         Functional mobility during ADLs: Minimal assistance;Rolling walker General ADL Comments: Focused session on dressing. Pt demonstrating understanding. Wife present to provide assistance as needed     Vision       Perception     Praxis      Cognition Arousal/Alertness: Awake/alert Behavior During Therapy: Flat affect Overall Cognitive Status: Within Functional Limits for tasks assessed                                 General Comments: Motivated        Exercises     Shoulder Instructions       General Comments HR 123 at rest. Elevating to 144 during dressing  Pertinent Vitals/ Pain       Pain Assessment: Faces Faces Pain Scale: Hurts even more Pain Location: neck and shoulders Pain Descriptors / Indicators: Aching;Cramping;Grimacing;Guarding Pain Intervention(s): Monitored during session;Limited activity within patient's tolerance;Repositioned  Home Living                                          Prior Functioning/Environment              Frequency  Min  3X/week        Progress Toward Goals  OT Goals(current goals can now be found in the care plan section)  Progress towards OT goals: Progressing toward goals  Acute Rehab OT Goals Patient Stated Goal: return to work and taking my kids to the park OT Goal Formulation: With patient Time For Goal Achievement: 09/03/18 Potential to Achieve Goals: Good ADL Goals Pt Will Perform Upper Body Bathing: with modified independence;sitting Pt Will Perform Lower Body Bathing: with modified independence;sit to/from stand;with adaptive equipment Pt Will Perform Upper Body Dressing: with modified independence;sitting Pt Will Perform Lower Body Dressing: with modified independence;sit to/from stand Pt/caregiver will Perform Home Exercise Program: Both right and left upper extremity;Increased ROM;Independently;With written HEP provided Additional ADL Goal #1: Pt/wife will independently verbalize 3 cervical precautions  Plan Discharge plan remains appropriate    Co-evaluation                 AM-PAC PT "6 Clicks" Daily Activity     Outcome Measure   Help from another person eating meals?: None Help from another person taking care of personal grooming?: A Little Help from another person toileting, which includes using toliet, bedpan, or urinal?: A Little Help from another person bathing (including washing, rinsing, drying)?: A Lot Help from another person to put on and taking off regular upper body clothing?: A Lot Help from another person to put on and taking off regular lower body clothing?: A Lot 6 Click Score: 16    End of Session    OT Visit Diagnosis: Pain;Muscle weakness (generalized) (M62.81) Pain - Right/Left: Left Pain - part of body: Shoulder;Arm   Activity Tolerance Patient tolerated treatment well   Patient Left in chair;with call bell/phone within reach;with family/visitor present   Nurse Communication Mobility status;Other (comment)(HR)        Time: 9323-5573 OT  Time Calculation (min): 14 min  Charges: OT General Charges $OT Visit: 1 Visit OT Treatments $Self Care/Home Management : 8-22 mins  [place], OTR/L Acute Rehab Pager: [phone] Office: [place] 08/25/2018, 1:53 PM

## 2018-08-25 NOTE — Progress Notes (Signed)
Patient has not been able to void post foley-catheter d/c. Patient attempted twice to void but unsuccessful. Bladder scan showed greater than 300 while sitting. I &O patient and received greater than 500 cc. Will continue to monitor.

## 2018-08-25 NOTE — Progress Notes (Signed)
Pt's HR 140s-150s while ambulating in hallway with PT. HR continued to sustain 130s-160s after returned to room. BP stable. Pt otherwise asymptomatic. Neurosurgery PA notified. Will continue to monitor.   Ara Kussmaul BSN, RN

## 2018-08-26 MED ORDER — TAMSULOSIN HCL 0.4 MG PO CAPS: 0.4000 mg | ORAL_CAPSULE | Freq: Every day | ORAL | 2 refills | Status: DC

## 2018-08-26 MED ORDER — OXYCODONE HCL 5 MG PO TABS: 5.0000 mg | ORAL_TABLET | ORAL | 0 refills | Status: DC | PRN

## 2018-08-26 MED ORDER — GABAPENTIN 300 MG PO CAPS: 300.0000 mg | ORAL_CAPSULE | Freq: Three times a day (TID) | ORAL | 2 refills | Status: DC

## 2018-08-26 MED ORDER — DIAZEPAM 10 MG PO TABS: 10.0000 mg | ORAL_TABLET | Freq: Four times a day (QID) | ORAL | 0 refills | Status: DC | PRN

## 2018-08-26 MED ORDER — DOCUSATE SODIUM 100 MG PO CAPS: 100.0000 mg | ORAL_CAPSULE | Freq: Two times a day (BID) | ORAL | 0 refills | Status: DC

## 2018-08-26 NOTE — Progress Notes (Signed)
Pt discharged home with wife. Telemetry box and IV removed. Pt and pt's wife, Dennis Carrillo, received discharge instructions and all questions were answered. Pt's follow-up appointment with [facility] was scheduled for Dec. 4th at 9:45 am. Pt instructed to call for appointment for outpatient PT and OT services. Pt and pt's wife aware. Pt discharged with foley, per order. Per Dr. Zada Finders, pt can just follow-up in his office rather than with an urologist. Pt switched to leg bag for home. Pt and pt's wife instructed on foley care and how to empty the urine. All questions were answered. Pt also received rolling walker and paper prescriptions. Pt instructed to get filled at his pharmacy. Pt verbalized understanding. Pt left with all of his belongings. Pt discharged via wheelchair and was accompanied by a Dennis Carrillo, pt's wife, and a Chartered certified accountant.   Ara Kussmaul BSN, RN

## 2018-08-26 NOTE — Care Management Note (Signed)
Case Management Note  Patient Details  Name: Dennis Carrillo MRN: [id number] Date of Birth: 24-Aug-1986  Subjective/Objective:               S/p cervical tumor resection     Action/Plan: PTA home with family. No HH needed. DME RW ordered and arranged for delivery to bedside prior to discharge. Patient to discharge with foley catheter and follow up with MD outpatient.  Expected Discharge Date:  08/26/18               Expected Discharge Plan:  OP Rehab  In-House Referral:  NA  Discharge planning Services  CM Consult  Post Acute Care Choice:  Durable Medical Equipment Choice offered to:  Patient  DME Arranged:  Gilford Rile rolling DME Agency:  [employer]:  NA [place] Agency:  NA  Status of Service:  Completed, signed off  If discussed at [place] of Stay Meetings, dates discussed:    Additional Comments:  Bartholomew Crews, RN 08/26/2018, 12:58 PM

## 2018-08-26 NOTE — Progress Notes (Signed)
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 No significant PMHx    PT Comments    Patient making steady progression toward his physical therapy goals in addition to verbalizing improved pain control this session. Remains somewhat limited by elevated HR 108-150 bpm during mobility. Session focused on continued progression of activity tolerance and functional mobility. Pt continues to require min assist for bed mobility and PT educated pt wife on technique for proper body mechanics. Ambulating 350 feet with walker and last 10 feet without assistive device with min guard assist. Mild unsteadiness but no overt LOB. Recommend walker for mobility use initially at home and discussed with CM.     Follow Up Recommendations  Outpatient PT     Equipment Recommendations  Rolling walker with 5" wheels    Recommendations for Other Services       Precautions / Restrictions Precautions Precautions: Fall;Cervical Precaution Booklet Issued: Yes (comment)(spanish copy) Precaution Comments: Reviewed cervical precautions' Required Braces or Orthoses: Other Brace/Splint Other Brace/Splint: No brace per MD order Restrictions Weight Bearing Restrictions: No    Mobility  Bed Mobility Overal bed mobility: Needs Assistance Bed Mobility: Rolling;Sidelying to Sit Rolling: Supervision Sidelying to sit: Min assist       General bed mobility comments: Pt wife provided cues for log roll. Pt requiring min assist to elevate trunk from sidelying to sitting.   Transfers Overall transfer level: Needs assistance Equipment used: None Transfers: Sit to/from Stand Sit to Stand: Min guard         General transfer comment: Min guard with bed height elevated  Ambulation/Gait Ambulation/Gait  assistance: Min guard Gait Distance (Feet): 350 Feet Assistive device: Rolling walker (2 wheeled);None Gait Pattern/deviations: Step-through pattern;Decreased stride length;Decreased dorsiflexion - right;Decreased dorsiflexion - left Gait velocity: decreased   General Gait Details: Pt with good posture throughout and no overt LOB. Assessed last ~10 feet with no assistive device and noted decreased arm swing. Mild unsteadiness noted   Stairs             Wheelchair Mobility    Modified Rankin (Stroke Patients Only)       Balance Overall balance assessment: Needs assistance Sitting-balance support: No upper extremity supported;Feet supported Sitting balance-Leahy Scale: Good     Standing balance support: During functional activity;No upper extremity supported Standing balance-Leahy Scale: Good                              Cognition Arousal/Alertness: Awake/alert Behavior During Therapy: Flat affect Overall Cognitive Status: Within Functional Limits for tasks assessed                                 General Comments: Motivated      Exercises      General Comments        Pertinent Vitals/Pain Pain Assessment: Faces Faces Pain Scale: Hurts little more Pain Location: neck and shoulders Pain Descriptors / Indicators: Grimacing;Guarding Pain Intervention(s): Monitored during session    Home Living                      Prior Function            PT Goals (current goals can now  be found in the care plan section) Acute Rehab PT Goals Patient Stated Goal: return to work and taking my kids to the park Potential to Achieve Goals: Good Progress towards PT goals: Progressing toward goals    Frequency    Min 5X/week      PT Plan Equipment recommendations need to be updated    Co-evaluation              AM-PAC PT "6 Clicks" Daily Activity  Outcome Measure  Difficulty turning over in bed (including adjusting  bedclothes, sheets and blankets)?: Unable Difficulty moving from lying on back to sitting on the side of the bed? : Unable Difficulty sitting down on and standing up from a chair with arms (e.g., wheelchair, bedside commode, etc,.)?: A Little Help needed moving to and from a bed to chair (including a wheelchair)?: A Little Help needed walking in hospital room?: A Little Help needed climbing 3-5 steps with a railing? : A Little 6 Click Score: 14    End of Session Equipment Utilized During Treatment: Gait belt Activity Tolerance: Patient tolerated treatment well Patient left: in chair;with call bell/phone within reach;with family/visitor present Nurse Communication: Mobility status;Other (comment)(DME) PT Visit Diagnosis: Other abnormalities of gait and mobility (R26.89);Unsteadiness on feet (R26.81);Other symptoms and signs involving the nervous system (R29.898)     Time: 6378-5885 PT Time Calculation (min) (ACUTE ONLY): 19 min  Charges:  $Gait Training: 8-22 mins                    Dennis Carrillo, [place], DPT Acute Rehabilitation Services Pager [phone] Office [phone]    Dennis Carrillo 08/26/2018, 12:14 PM

## 2018-08-26 NOTE — Plan of Care (Signed)
  Problem: Health Behavior/Discharge Planning: Goal: Ability to manage health-related needs will improve Outcome: Progressing   Problem: Clinical Measurements: Goal: Ability to maintain clinical measurements within normal limits will improve Outcome: Progressing Goal: Will remain free from infection Outcome: Progressing Goal: Diagnostic test results will improve Outcome: Progressing Goal: Respiratory complications will improve Outcome: Progressing Goal: Cardiovascular complication will be avoided Outcome: Progressing   Problem: Activity: Goal: Risk for activity intolerance will decrease Outcome: Progressing   Problem: Nutrition: Goal: Adequate nutrition will be maintained Outcome: Progressing   Problem: Coping: Goal: Level of anxiety will decrease Outcome: Progressing   Problem: Elimination: Goal: Will not experience complications related to bowel motility Outcome: Progressing Goal: Will not experience complications related to urinary retention Outcome: Progressing   Problem: Pain Managment: Goal: General experience of comfort will improve Outcome: Progressing   Problem: Safety: Goal: Ability to remain free from injury will improve Outcome: Progressing   Problem: Skin Integrity: Goal: Risk for impaired skin integrity will decrease Outcome: Progressing   Problem: Education: Goal: Ability to verbalize activity precautions or restrictions will improve Outcome: Progressing Goal: Knowledge of the prescribed therapeutic regimen will improve Outcome: Progressing Goal: Understanding of discharge needs will improve Outcome: Progressing   Problem: Activity: Goal: Ability to avoid complications of mobility impairment will improve Outcome: Progressing Goal: Ability to tolerate increased activity will improve Outcome: Progressing Goal: Will remain free from falls Outcome: Progressing   Problem: Bowel/Gastric: Goal: Gastrointestinal status for postoperative course  will improve Outcome: Progressing   Problem: Clinical Measurements: Goal: Ability to maintain clinical measurements within normal limits will improve Outcome: Progressing Goal: Postoperative complications will be avoided or minimized Outcome: Progressing Goal: Diagnostic test results will improve Outcome: Progressing   Problem: Pain Management: Goal: Pain level will decrease Outcome: Progressing   Problem: Skin Integrity: Goal: Will show signs of wound healing Outcome: Progressing   Problem: Health Behavior/Discharge Planning: Goal: Identification of resources available to assist in meeting health care needs will improve Outcome: Progressing   Problem: Bladder/Genitourinary: Goal: Urinary functional status for postoperative course will improve Outcome: Progressing

## 2018-08-26 NOTE — Discharge Summary (Signed)
Discharge Summary  Date of Admission: 08/19/2018  Date of Discharge: 08/26/18  Attending Physician: Emelda Brothers, MD  Hospital Course: Patient was admitted following an uncomplicated posterior cervical laminectomy, removal of nerve sheath tumor, and instrumented fusion. He was recovered in PACU and transferred to the neuro ICU for close monitoring. His hospital course was notable for significant post-operative pain requiring an extended period on a PCA. He also had some tachycardia that was pain-related, EKG showing normal sinus tachycardia, and urinary retention requiring replacement of his foley twice. A post-op MRI showed a gross total removal of his tumor. He was discharged home on 08/26/18 with outpatient PT and OT. He will follow up in clinic with me in 1 week.  Neurologic exam at discharge:  AOx3, PERRL, EOMI, FS, TM Strength 5/5 x4 except 4+ in L delt, [place], MD 08/26/18 9:28 AM

## 2018-08-26 NOTE — Progress Notes (Signed)
Neurosurgery Service Progress Note  Subjective: No issues overnight, pain improved  Objective: Vitals:   08/25/18 1949 08/25/18 2318 08/26/18 0408 08/26/18 0805  BP: (!) 115/93 (!) 130/93 (!) 119/97 121/79  Pulse: 91 (!) 104 99 (!) 110  Resp: 13 14 14 14   Temp: 98.7 F (37.1 C) 98.3 F (36.8 C) 98.4 F (36.9 C) 98.5 F (36.9 C)  TempSrc: Oral Axillary Oral Oral  SpO2: 98% 96% 97% 98%  Weight:      Height:       Temp (24hrs), Avg:98.7 F (37.1 C), Min:98.3 F (36.8 C), Max:99.7 F (37.6 C)  CBC Latest Ref Rng & Units 08/24/2018 08/19/2018 08/19/2018  WBC 4.0 - 10.5 K/uL 9.7 - -  Hemoglobin 13.0 - 17.0 g/dL 13.9 12.2(L) 12.6(L)  Hematocrit 39.0 - 52.0 % 40.8 36.0(L) 37.0(L)  Platelets 150 - 400 K/uL 342 - -   BMP Latest Ref Rng & Units 08/24/2018 08/19/2018 08/19/2018  Glucose 70 - 99 mg/dL 109(H) 113(H) 107(H)  BUN 6 - 20 mg/dL 8 - -  Creatinine 0.61 - 1.24 mg/dL 0.74 - -  Sodium 135 - 145 mmol/L 137 142 143  Potassium 3.5 - 5.1 mmol/L 3.5 3.9 3.6  Chloride 98 - 111 mmol/L 102 - -  CO2 22 - 32 mmol/L 25 - -  Calcium 8.9 - 10.3 mg/dL 9.1 - -    Intake/Output Summary (Last 24 hours) at 08/26/2018 0857 Last data filed at 08/26/2018 0400 Gross per 24 hour  Intake -  Output 1375 ml  Net -1375 ml    Current Facility-Administered Medications:  .  0.9 %  sodium chloride infusion, 250 mL, Intravenous, Continuous, Torren Maffeo A, MD, Last Rate: 1 mL/hr at 08/20/18 1900, 250 mL at 08/20/18 1900 .  acetaminophen (TYLENOL) tablet 650 mg, 650 mg, Oral, Q4H PRN, 650 mg at 08/26/18 0644 **OR** acetaminophen (TYLENOL) suppository 650 mg, 650 mg, Rectal, Q4H PRN, Judith Part, MD .  diazepam (VALIUM) tablet 10 mg, 10 mg, Oral, Q6H PRN, Judith Part, MD, 10 mg at 08/24/18 1928 .  docusate sodium (COLACE) capsule 100 mg, 100 mg, Oral, BID, Judith Part, MD, 100 mg at 08/25/18 2150 .  gabapentin (NEURONTIN) capsule 300 mg, 300 mg, Oral, TID, Judith Part, MD, 300 mg at 08/25/18 2150 .  heparin injection 5,000 Units, 5,000 Units, Subcutaneous, Q8H, Judith Part, MD, 5,000 Units at 08/26/18 [phone] .  HYDROmorphone (DILAUDID) injection 0.5 mg, 0.5 mg, Intravenous, Q3H PRN, Kwesi Sangha A, MD .  lidocaine (LIDODERM) 5 % 2 patch, 2 patch, Transdermal, Q24H, Marionna Gonia, Joyice Faster, MD, 2 patch at 08/25/18 1544 .  menthol-cetylpyridinium (CEPACOL) lozenge 3 mg, 1 lozenge, Oral, PRN, 3 mg at 08/20/18 1535 **OR** phenol (CHLORASEPTIC) mouth spray 1 spray, 1 spray, Mouth/Throat, PRN, Sheriann Newmann A, MD .  ondansetron (ZOFRAN) tablet 4 mg, 4 mg, Oral, Q6H PRN **OR** ondansetron (ZOFRAN) injection 4 mg, 4 mg, Intravenous, Q6H PRN, Pang Robers A, MD .  oxyCODONE (Oxy IR/ROXICODONE) immediate release tablet 10 mg, 10 mg, Oral, Q4H PRN, Judith Part, MD, 10 mg at 08/25/18 1343 .  oxyCODONE (Oxy IR/ROXICODONE) immediate release tablet 5 mg, 5 mg, Oral, Q4H PRN, Judith Part, MD, 5 mg at 08/24/18 2115 .  polyethylene glycol (MIRALAX / GLYCOLAX) packet 17 g, 17 g, Oral, Daily PRN, Judith Part, MD, 17 g at 08/24/18 1256 .  sodium chloride flush (NS) 0.9 % injection 3 mL, 3 mL, Intravenous, Q12H, Decklyn Hyder, Joyice Faster,  MD, 3 mL at 08/25/18 2151 .  sodium chloride flush (NS) 0.9 % injection 3 mL, 3 mL, Intravenous, PRN, Judith Part, MD .  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 -discharge home today w/ leg bag -will try to get short term urology follow up, otherwise will see in clinic next week and d/c foley  Judith Part  08/26/18 8:57 AM

## 2018-09-02 ENCOUNTER — Ambulatory Visit (HOSPITAL_COMMUNITY)
Admission: EM | Admit: 2018-09-02 | Discharge: 2018-09-02 | Disposition: A | Discharge status: Home / Self Care | Payer: Managed Care, Other (non HMO) | Attending: Family Medicine | Admitting: Family Medicine

## 2018-09-02 ENCOUNTER — Encounter (HOSPITAL_COMMUNITY): Payer: Self-pay | Admitting: Emergency Medicine

## 2018-09-02 DIAGNOSIS — Z9889 Other specified postprocedural states: Secondary | ICD-10-CM | POA: Diagnosis not present

## 2018-09-02 DIAGNOSIS — N390 Urinary tract infection, site not specified: Secondary | ICD-10-CM | POA: Diagnosis not present

## 2018-09-02 DIAGNOSIS — Z79899 Other long term (current) drug therapy: Secondary | ICD-10-CM | POA: Diagnosis not present

## 2018-09-02 DIAGNOSIS — Z466 Encounter for fitting and adjustment of urinary device: Secondary | ICD-10-CM | POA: Insufficient documentation

## 2018-09-02 DIAGNOSIS — M1990 Unspecified osteoarthritis, unspecified site: Secondary | ICD-10-CM | POA: Insufficient documentation

## 2018-09-02 DIAGNOSIS — R300 Dysuria: Secondary | ICD-10-CM | POA: Insufficient documentation

## 2018-09-02 LAB — POCT I-STAT, CHEM 8
BUN: 12 mg/dL (ref 6–20)
Calcium, Ion: 1.11 mmol/L — ABNORMAL LOW (ref 1.15–1.40)
Chloride: 101 mmol/L (ref 98–111)
Creatinine, Ser: 0.9 mg/dL (ref 0.61–1.24)
Glucose, Bld: 113 mg/dL — ABNORMAL HIGH (ref 70–99)
HCT: 47 % (ref 39.0–52.0)
Hemoglobin: 16 g/dL (ref 13.0–17.0)
Potassium: 4.1 mmol/L (ref 3.5–5.1)
Sodium: 134 mmol/L — ABNORMAL LOW (ref 135–145)
TCO2: 24 mmol/L (ref 22–32)

## 2018-09-02 LAB — CBC WITH DIFFERENTIAL/PLATELET
ABS IMMATURE GRANULOCYTES: 0.05 10*3/uL (ref 0.00–0.07)
BASOS PCT: 0 %
Basophils Absolute: 0 10*3/uL (ref 0.0–0.1)
EOS PCT: 1 %
Eosinophils Absolute: 0.2 10*3/uL (ref 0.0–0.5)
HCT: 44.9 % (ref 39.0–52.0)
Hemoglobin: 15.5 g/dL (ref 13.0–17.0)
Immature Granulocytes: 0 %
Lymphocytes Relative: 24 %
Lymphs Abs: 3.1 10*3/uL (ref 0.7–4.0)
MCH: 28.6 pg (ref 26.0–34.0)
MCHC: 34.5 g/dL (ref 30.0–36.0)
MCV: 82.8 fL (ref 80.0–100.0)
MONOS PCT: 8 %
Monocytes Absolute: 1.1 10*3/uL — ABNORMAL HIGH (ref 0.1–1.0)
NEUTROS ABS: 8.7 10*3/uL — AB (ref 1.7–7.7)
Neutrophils Relative %: 67 %
PLATELETS: 488 10*3/uL — AB (ref 150–400)
RBC: 5.42 MIL/uL (ref 4.22–5.81)
RDW: 12 % (ref 11.5–15.5)
WBC: 13.1 10*3/uL — AB (ref 4.0–10.5)
nRBC: 0 % (ref 0.0–0.2)

## 2018-09-02 LAB — POCT URINALYSIS DIP (DEVICE)
Bilirubin Urine: NEGATIVE
GLUCOSE, UA: NEGATIVE mg/dL
Nitrite: POSITIVE — AB
PH: 6 (ref 5.0–8.0)
PROTEIN: 30 mg/dL — AB
SPECIFIC GRAVITY, URINE: 1.025 (ref 1.005–1.030)
UROBILINOGEN UA: 1 mg/dL (ref 0.0–1.0)

## 2018-09-02 MED ORDER — CEFTRIAXONE SODIUM 1 G IJ SOLR
1.0000 g | Freq: Once | INTRAMUSCULAR | Status: AC
  Administered 2018-09-02: 1 g via INTRAMUSCULAR

## 2018-09-02 MED ORDER — CEFTRIAXONE SODIUM 1 G IJ SOLR
INTRAMUSCULAR | Status: AC
  Filled 2018-09-02: qty 10

## 2018-09-02 MED ORDER — LIDOCAINE HCL (PF) 1 % IJ SOLN
INTRAMUSCULAR | Status: AC
  Filled 2018-09-02: qty 2

## 2018-09-02 MED ORDER — SULFAMETHOXAZOLE-TRIMETHOPRIM 800-160 MG PO TABS: 1.0000 | ORAL_TABLET | Freq: Two times a day (BID) | ORAL | 0 refills | Status: AC

## 2018-09-02 NOTE — ED Provider Notes (Signed)
[facility]    CSN: [id number] Arrival date & time: 09/02/18  1416     History   Chief Complaint Chief Complaint  Patient presents with  . Follow-up    HPI Dennis Carrillo is a 32 y.o. male history of cervical spine tumor with recent laminectomy presenting today for Foley catheter removal.  Patient had surgery on 08/19/2018, he was discharged from the hospital on 11/20.  Had issues with urinary retention in the hospital and was sent home with catheter.  Patient states that over the past week he has had worsening burning and discomfort with the catheter.  He is hoping to have this removed today.  He has also had neck pain, but his main concern is the burning with urination.  He denies any fevers.  Denies headaches.  Denies dizziness or lightheadedness.  Denies shortness of breath or chest pain.  HPI  Past Medical History:  Diagnosis Date  . Arthritis     Patient Active Problem List   Diagnosis Date Noted  . 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 APPENDECTOMY    . LAMINECTOMY N/A 08/19/2018   Procedure: Cervical four to Cervical six posterior cervical laminectomy for intradural tumor, cervical four to cervical six instrumented fusion;  Surgeon: Judith Part, MD;  Location: [facility];  Service: Neurosurgery;  Laterality: N/A;  . WISDOM TOOTH EXTRACTION         Home Medications    Prior to Admission medications   Medication Sig Start Date End Date Taking? Authorizing Provider  diazepam (VALIUM) 10 MG tablet Take 1 tablet (10 mg total) by mouth every 6 (six) hours as needed. 08/26/18   Judith Part, MD  docusate sodium (COLACE) 100 MG capsule Take 1 capsule (100 mg total) by mouth 2 (two) times daily. 08/26/18   Judith Part, MD  gabapentin (NEURONTIN) 300 MG capsule Take 1 capsule (300 mg total) by mouth 3 (three) times daily. 08/26/18   Judith Part, MD  oxyCODONE (OXY IR/ROXICODONE) 5 MG immediate release tablet Take 1 tablet (5 mg total) by mouth every 4 (four) hours as needed (pain). 08/26/18   Judith Part, MD  sulfamethoxazole-trimethoprim (BACTRIM DS,SEPTRA DS) 800-160 MG tablet Take 1 tablet by mouth 2 (two) times daily for 7 days. 09/02/18 09/09/18  Oneika Simonian C, PA-C  tamsulosin (FLOMAX) 0.4 MG CAPS capsule Take 1 capsule (0.4 mg total) by mouth daily. 08/26/18   Judith Part, MD    Family History History reviewed. No pertinent family history.  Social History Social History   Tobacco Use  . Smoking status: Never Smoker  . Smokeless tobacco: Never Used  Substance Use Topics  . Alcohol use: Not Currently  . Drug use: Never     Allergies   Patient has no known allergies.   Review of Systems Review of Systems  Constitutional: Negative for fever.  HENT: Negative for sore throat.   Respiratory: Negative for shortness of breath.   Cardiovascular: Negative for chest pain.  Gastrointestinal: Negative for abdominal pain, nausea and vomiting.  Genitourinary: Positive for dysuria. Negative for difficulty urinating, discharge, frequency, penile pain, penile swelling, scrotal swelling and testicular pain.  Skin: Negative for rash.  Neurological: Negative for dizziness, light-headedness and headaches.     Physical Exam Triage Vital Signs ED Triage Vitals  Enc Vitals Group     BP 09/02/18 1452 115/72  Pulse Rate 09/02/18 1452 (!) 123     Resp 09/02/18 1452 18     Temp 09/02/18 1452 98 F (36.7 C)     Temp Source 09/02/18 1452 Oral     SpO2 09/02/18 1452 95 %     Weight --      Height --      Head Circumference --      Peak Flow --      Pain Score 09/02/18 1453 10     Pain Loc --      Pain Edu? --      Excl. in [address]? --    No data found.  Updated Vital Signs BP 115/72 (BP Location: Right Arm)   Pulse (!) 123   Temp 98 F (36.7 C) (Oral)   Resp 18   SpO2 95%   Visual  Acuity Right Eye Distance:   Left Eye Distance:   Bilateral Distance:    Right Eye Near:   Left Eye Near:    Bilateral Near:     Physical Exam  Constitutional: He is oriented to person, place, and time. He appears well-developed and well-nourished.  Patient appears pale and uncomfortable  HENT:  Head: Normocephalic and atraumatic.  Nose: Nose normal.  Eyes: Conjunctivae are normal.  Neck: Neck supple.  Well-healing large incision to cervical spine, surrounding erythema just immediately around incision with scabbing, no increased warmth  Cardiovascular: Regular rhythm.  No murmur heard. Tachycardic  Pulmonary/Chest: Effort normal and breath sounds normal. No respiratory distress.  Abdominal: Soft. He exhibits no distension. There is no tenderness.  Genitourinary:  Genitourinary Comments: Urethral meatus with discharge present after Foley removal  Musculoskeletal: Normal range of motion. He exhibits no edema.  Neurological: He is alert and oriented to person, place, and time.  Skin: Skin is warm and dry.  Psychiatric: He has a normal mood and affect.  Nursing note and vitals reviewed.    UC Treatments / Results  Labs (all labs ordered are listed, but only abnormal results are displayed) Labs Reviewed  CBC WITH DIFFERENTIAL/PLATELET - Abnormal; Notable for the following components:      Result Value   WBC 13.1 (*)    Platelets 488 (*)    Neutro Abs 8.7 (*)    Monocytes Absolute 1.1 (*)    All other components within normal limits  POCT URINALYSIS DIP (DEVICE) - Abnormal; Notable for the following components:   Ketones, ur TRACE (*)    Hgb urine dipstick LARGE (*)    Protein, ur 30 (*)    Nitrite POSITIVE (*)    Leukocytes, UA SMALL (*)    All other components within normal limits  POCT I-STAT, CHEM 8 - Abnormal; Notable for the following components:   Sodium 134 (*)    Glucose, Bld 113 (*)    Calcium, Ion 1.11 (*)    All other components within normal limits   URINE CULTURE    EKG None  Radiology No results found.  Procedures Procedures (including critical care time)  Medications Ordered in UC Medications  cefTRIAXone (ROCEPHIN) injection 1 g (1 g Intramuscular Given 09/02/18 1551)    Initial Impression / Assessment and Plan / UC Course  I have reviewed the triage vital signs and the nursing notes.  Pertinent labs & imaging results that were available during my care of the patient were reviewed by me and considered in my medical decision making (see chart for details).    Foley catheter removed by nursing staff,  patient able to void in clinic, UA showing signs of infection.  Given significant tachycardia, will treat with Rocephin prior to discharge and sent home with Bactrim for 1 week.  Also checked i-STAT and CBC given tachycardia.  Hemoglobin normal.  Electrolytes relatively normal.  CBC slightly elevated with white count at 13.1.  Will have patient to monitor her symptoms and heart rate, follow-up if symptoms not resolving or symptoms worsening.  Also to follow-up if not voiding over 4 to 6-hour.Discussed strict return precautions. Patient verbalized understanding and is agreeable with plan.   Final Clinical Impressions(s) / UC Diagnoses   Final diagnoses:  Encounter for Foley catheter removal  Dysuria     Discharge Instructions     Urine showed signs of infection- we are giving him a shot of rocephin to help with infection Begin Bactrim twice daily for 1 week  Please go to emergency room if developing fever, weakness, symptoms not improving, unable to urinate for 4-6 hours   ED Prescriptions    Medication Sig Dispense Auth. Provider   sulfamethoxazole-trimethoprim (BACTRIM DS,SEPTRA DS) 800-160 MG tablet Take 1 tablet by mouth 2 (two) times daily for 7 days. 14 tablet Chanon Loney, [place] C, PA-C     Controlled Substance Prescriptions [place] Controlled Substance Registry consulted? Not Applicable   Janith Lima,  [place] 09/02/18 1849

## 2018-09-02 NOTE — Discharge Instructions (Signed)
Urine showed signs of infection- we are giving him a shot of rocephin to help with infection Begin Bactrim twice daily for 1 week  Please go to emergency room if developing fever, weakness, symptoms not improving, unable to urinate for 4-6 hours

## 2018-09-02 NOTE — ED Triage Notes (Signed)
Pt here for foley cath to be removed; pt had cervical sx and had cath for retention and was burning and having increased pain per pt

## 2018-09-03 LAB — URINE CULTURE

## 2018-09-15 ENCOUNTER — Other Ambulatory Visit: Payer: Self-pay

## 2018-09-15 ENCOUNTER — Encounter (HOSPITAL_COMMUNITY): Payer: Self-pay | Admitting: *Deleted

## 2018-09-15 ENCOUNTER — Emergency Department (HOSPITAL_COMMUNITY)
Admission: EM | Admit: 2018-09-15 | Discharge: 2018-09-15 | Disposition: A | Discharge status: Home / Self Care | Payer: 59 | Attending: Emergency Medicine | Admitting: Emergency Medicine

## 2018-09-15 DIAGNOSIS — R51 Headache: Secondary | ICD-10-CM | POA: Diagnosis not present

## 2018-09-15 DIAGNOSIS — R300 Dysuria: Secondary | ICD-10-CM | POA: Diagnosis present

## 2018-09-15 DIAGNOSIS — K5289 Other specified noninfective gastroenteritis and colitis: Secondary | ICD-10-CM | POA: Diagnosis not present

## 2018-09-15 DIAGNOSIS — R10814 Left lower quadrant abdominal tenderness: Secondary | ICD-10-CM | POA: Diagnosis not present

## 2018-09-15 DIAGNOSIS — Z9889 Other specified postprocedural states: Secondary | ICD-10-CM | POA: Diagnosis not present

## 2018-09-15 DIAGNOSIS — B349 Viral infection, unspecified: Secondary | ICD-10-CM | POA: Insufficient documentation

## 2018-09-15 DIAGNOSIS — R112 Nausea with vomiting, unspecified: Secondary | ICD-10-CM | POA: Insufficient documentation

## 2018-09-15 DIAGNOSIS — K521 Toxic gastroenteritis and colitis: Secondary | ICD-10-CM

## 2018-09-15 LAB — URINALYSIS, ROUTINE W REFLEX MICROSCOPIC
BILIRUBIN URINE: NEGATIVE
GLUCOSE, UA: NEGATIVE mg/dL
Hgb urine dipstick: NEGATIVE
Ketones, ur: NEGATIVE mg/dL
Leukocytes, UA: NEGATIVE
Nitrite: NEGATIVE
Protein, ur: NEGATIVE mg/dL
Specific Gravity, Urine: 1.027 (ref 1.005–1.030)
pH: 5 (ref 5.0–8.0)

## 2018-09-15 LAB — COMPREHENSIVE METABOLIC PANEL
ALT: 37 U/L (ref 0–44)
AST: 18 U/L (ref 15–41)
Albumin: 3.9 g/dL (ref 3.5–5.0)
Alkaline Phosphatase: 45 U/L (ref 38–126)
Anion gap: 12 (ref 5–15)
BUN: 7 mg/dL (ref 6–20)
CO2: 20 mmol/L — ABNORMAL LOW (ref 22–32)
Calcium: 9.6 mg/dL (ref 8.9–10.3)
Chloride: 106 mmol/L (ref 98–111)
Creatinine, Ser: 0.78 mg/dL (ref 0.61–1.24)
GFR calc non Af Amer: >60 mL/min (ref >60)
Glucose, Bld: 98 mg/dL (ref 70–99)
POTASSIUM: 3.6 mmol/L (ref 3.5–5.1)
Sodium: 138 mmol/L (ref 135–145)
Total Bilirubin: 0.9 mg/dL (ref 0.3–1.2)
Total Protein: 6.8 g/dL (ref 6.5–8.1)

## 2018-09-15 LAB — CBC WITH DIFFERENTIAL/PLATELET
Abs Immature Granulocytes: 0.02 10*3/uL (ref 0.00–0.07)
BASOS PCT: 0 %
Basophils Absolute: 0 10*3/uL (ref 0.0–0.1)
Eosinophils Absolute: 0.1 10*3/uL (ref 0.0–0.5)
Eosinophils Relative: 1 %
HCT: 39.4 % (ref 39.0–52.0)
Hemoglobin: 13.4 g/dL (ref 13.0–17.0)
Immature Granulocytes: 0 %
Lymphocytes Relative: 30 %
Lymphs Abs: 2.1 10*3/uL (ref 0.7–4.0)
MCH: 28.8 pg (ref 26.0–34.0)
MCHC: 34 g/dL (ref 30.0–36.0)
MCV: 84.7 fL (ref 80.0–100.0)
Monocytes Absolute: 0.6 10*3/uL (ref 0.1–1.0)
Monocytes Relative: 8 %
Neutro Abs: 4.2 10*3/uL (ref 1.7–7.7)
Neutrophils Relative %: 61 %
Platelets: 287 10*3/uL (ref 150–400)
RBC: 4.65 MIL/uL (ref 4.22–5.81)
RDW: 12.6 % (ref 11.5–15.5)
WBC: 7 10*3/uL (ref 4.0–10.5)
nRBC: 0 % (ref 0.0–0.2)

## 2018-09-15 LAB — I-STAT CG4 LACTIC ACID, ED: Lactic Acid, Venous: 1.09 mmol/L (ref 0.5–1.9)

## 2018-09-15 LAB — LIPASE, BLOOD: Lipase: 136 U/L — ABNORMAL HIGH (ref 11–51)

## 2018-09-15 MED ORDER — ONDANSETRON HCL 4 MG PO TABS: 4.0000 mg | ORAL_TABLET | Freq: Three times a day (TID) | ORAL | 0 refills | Status: AC | PRN

## 2018-09-15 MED ORDER — ONDANSETRON HCL 4 MG/2ML IJ SOLN
4.0000 mg | Freq: Once | INTRAMUSCULAR | Status: AC
  Administered 2018-09-15: 4 mg via INTRAVENOUS
  Filled 2018-09-15: qty 2

## 2018-09-15 MED ORDER — DIPHENHYDRAMINE HCL 50 MG/ML IJ SOLN
25.0000 mg | Freq: Once | INTRAMUSCULAR | Status: AC
  Administered 2018-09-15: 25 mg via INTRAVENOUS
  Filled 2018-09-15: qty 1

## 2018-09-15 MED ORDER — PROCHLORPERAZINE MALEATE 5 MG PO TABS
10.0000 mg | ORAL_TABLET | Freq: Once | ORAL | Status: AC
  Administered 2018-09-15: 10 mg via ORAL
  Filled 2018-09-15: qty 2

## 2018-09-15 MED ORDER — SODIUM CHLORIDE 0.9 % IV BOLUS
1000.0000 mL | Freq: Once | INTRAVENOUS | Status: AC
  Administered 2018-09-15: 1000 mL via INTRAVENOUS

## 2018-09-15 NOTE — ED Notes (Signed)
Patient verbalizes understanding of discharge instructions. Opportunity for questioning and answers were provided. Armband removed by staff, pt discharged from ED. Pt ambulated out to lobby with spouse

## 2018-09-15 NOTE — ED Provider Notes (Signed)
[facility] EMERGENCY DEPARTMENT Provider Note   CSN: [id number] Arrival date & time: 09/15/18  9323     History   Chief Complaint No chief complaint on file.   HPI Dennis Carrillo is a 32 y.o. male with PMH laminectomy 11/20 & foley presenting with resistant UTI treated with bactrim and levofloxacin with no resolution but worsening symptoms of dysuria, fever, chills and CVA tenderness. Post surgery foley was kept for 7 days due to urinary retention and removed 11/27. He has had no difficulty with urinating since foley removal but continues to have pain with urination and developed nausea and vomiting three days ago. He denies hematuria, purulent discharge. He states he has had diarrhea. He states he has never had issues with UTI, urinary retention previous to this. He states he completed both his bactrim and would finish his levo today. His children are also sick at home with head colds and vomiting.   Surgical site is healing well. He endorses headache and shoulder pain which he sees physical therapy for.  He was additionally given flomax after surgery but has not been taking this.  Appendectomy in 2009.     The history is provided by the patient and a relative. The history is limited by a language barrier.    Past Medical History:  Diagnosis Date  . Arthritis     Patient Active Problem List   Diagnosis Date Noted  . 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 APPENDECTOMY    . LAMINECTOMY N/A 08/19/2018   Procedure: Cervical four to Cervical six posterior cervical laminectomy for intradural tumor, cervical four to cervical six instrumented fusion;  Surgeon: Judith Part, MD;  Location: [facility];  Service: Neurosurgery;  Laterality: N/A;  . WISDOM TOOTH EXTRACTION          Home Medications    Prior to Admission medications   Medication Sig Start Date End  Date Taking? Authorizing Provider  aspirin-acetaminophen-caffeine (EXCEDRIN MIGRAINE) 303 028 3555 MG tablet Take 1 tablet by mouth every 6 (six) hours as needed for headache.   Yes [provider]  diazepam (VALIUM) 10 MG tablet Take 1 tablet (10 mg total) by mouth every 6 (six) hours as needed. Patient taking differently: Take 10 mg by mouth every 6 (six) hours as needed (muscle spasms).  08/26/18  Yes Judith Part, MD  gabapentin (NEURONTIN) 300 MG capsule Take 1 capsule (300 mg total) by mouth 3 (three) times daily. 08/26/18  Yes Judith Part, MD  levofloxacin (LEVAQUIN) 750 MG tablet Take 750 mg by mouth daily. 09/09/18  Yes [provider]  oxyCODONE (OXY IR/ROXICODONE) 5 MG immediate release tablet Take 1 tablet (5 mg total) by mouth every 4 (four) hours as needed (pain). 08/26/18  Yes Judith Part, MD  docusate sodium (COLACE) 100 MG capsule Take 1 capsule (100 mg total) by mouth 2 (two) times daily. Patient not taking: Reported on 09/15/2018 08/26/18   Judith Part, MD  ondansetron (ZOFRAN) 4 MG tablet Take 1 tablet (4 mg total) by mouth every 8 (eight) hours as needed for up to 2 days for nausea or vomiting. 09/15/18 09/17/18  Keyvon Herter A, DO  tamsulosin (FLOMAX) 0.4 MG CAPS capsule Take 1 capsule (0.4 mg total) by mouth daily. Patient not taking: Reported on 09/15/2018 08/26/18   Judith Part, MD    Family History No family history on file.  Social History Social History   Tobacco Use  . Smoking status: Never Smoker  . Smokeless tobacco: Never Used  Substance Use Topics  . Alcohol use: Not Currently  . Drug use: Never     Allergies   Patient has no known allergies.   Review of Systems All other ROS negative except as noted in the HPI.    Physical Exam Updated Vital Signs BP 111/80   Pulse 71   Temp 98.7 F (37.1 C) (Oral)   Resp 16   Ht 5\' 3"  (1.6 m)   Wt 78.3 kg   SpO2 99%   BMI 30.58 kg/m   Physical  Exam  Constitutional: He is oriented to person, place, and time. He appears well-developed and well-nourished. He has a sickly appearance. No distress.  Eyes: Conjunctivae and EOM are normal.  Neck: Normal range of motion. No neck rigidity.  Cardiovascular: Normal rate, regular rhythm, S1 normal, S2 normal and normal heart sounds.  Pulmonary/Chest: Effort normal and breath sounds normal. No respiratory distress.  Abdominal: Soft. Bowel sounds are normal. He exhibits no distension. There is tenderness in the left lower quadrant. There is CVA tenderness.  Musculoskeletal: Normal range of motion. He exhibits no edema.  Neurological: He is alert and oriented to person, place, and time.  Skin: Skin is warm. He is diaphoretic. No erythema.  Cervical site c/d/i   Psychiatric: He has a normal mood and affect. His behavior is normal.     ED Treatments / Results  Labs (all labs ordered are listed, but only abnormal results are displayed) Labs Reviewed  URINALYSIS, ROUTINE W REFLEX MICROSCOPIC - Abnormal; Notable for the following components:      Result Value   APPearance HAZY (*)    All other components within normal limits  COMPREHENSIVE METABOLIC PANEL - Abnormal; Notable for the following components:   CO2 20 (*)    All other components within normal limits  LIPASE, BLOOD - Abnormal; Notable for the following components:   Lipase 136 (*)    All other components within normal limits  URINE CULTURE  CULTURE, BLOOD (ROUTINE X 2)  CULTURE, BLOOD (ROUTINE X 2)  CBC WITH DIFFERENTIAL/PLATELET  I-STAT CG4 LACTIC ACID, ED  I-STAT CG4 LACTIC ACID, ED    EKG None  Radiology No results found.  Procedures Procedures (including critical care time)  Medications Ordered in ED Medications  sodium chloride 0.9 % bolus 1,000 mL (0 mLs Intravenous Stopped 09/15/18 1200)  ondansetron (ZOFRAN) injection 4 mg (4 mg Intravenous Given 09/15/18 1150)  diphenhydrAMINE (BENADRYL) injection 25 mg (25  mg Intravenous Given 09/15/18 1233)  prochlorperazine (COMPAZINE) tablet 10 mg (10 mg Oral Given 09/15/18 1233)     Initial Impression / Assessment and Plan / ED Course  I have reviewed the triage vital signs and the nursing notes.  Pertinent labs & imaging results that were available during my care of the patient were reviewed by me and considered in my medical decision making (see chart for details).  Clinical Course as of Sep 15 1310  Tue Sep 15, 2018  1046 Dysuria with recently diagnosed UTI with failed antibiotic therapy w/bactrim and levo. Vitals within normal limits, afebrile. Will obtain labs.    [JS]    Clinical Course User Index [JS] Briyan Kleven A, DO    UA, CBC, CMP unremarkable and does not seem to warrant more antibiotics. Children have been sick at home as well, and it is likely he caught their viral illness. Cannot  test for flu as swabs not available. No neck stiffness, confusion, or swelling at surgical site that would be concerning for acute infection. Dysuria is also likely 2/2 recent cath. Patient's nausea and headache resolved with migraine cocktail and zofran. Will send home with zofran, recommend close f/u with PCP and return precautions.   Final Clinical Impressions(s) / ED Diagnoses   Final diagnoses:  Viral illness  Diarrhea due to drug  Dysuria    ED Discharge Orders         Ordered    ondansetron (ZOFRAN) 4 MG tablet  Every 8 hours PRN     09/15/18 1303           Aryaman Haliburton A, DO 09/15/18 1311    Little, Wenda Overland, MD 09/20/18 1327

## 2018-09-15 NOTE — ED Triage Notes (Signed)
Patient presents to ed states he has been having fever and chills for 2-3 weeks worse today , states he had urinary retention after surgery and had a foley , of which was removed 11/27 at [facility]. States he was given two different antibiotics  For UTI and states he is still having burning with urination. C/o headache.

## 2018-09-15 NOTE — Discharge Instructions (Addendum)
Gracias por permitirnos cuidar para ti hoy.   Por su nauseas, toma zofran (ondansetron) 4mg  cada ocho hora cuando necessita.  Por su dolor de [place], toma acetamenophen y Ibuprofen cuando necessita.   [place] su medico de cabecera y progama una cita en dos dias.   Si tienes fiebre, empeora nausea y vomito, o empeora sintomas por favor regresar al departamento de [employer].

## 2018-09-16 LAB — URINE CULTURE: Culture: NO GROWTH

## 2018-09-22 LAB — CULTURE, BLOOD (ROUTINE X 2)
Culture: NO GROWTH
SPECIAL REQUESTS: ADEQUATE

## 2019-05-18 ENCOUNTER — Other Ambulatory Visit: Payer: Self-pay | Admitting: Neurological Surgery

## 2019-08-20 ENCOUNTER — Other Ambulatory Visit: Payer: Self-pay

## 2019-08-20 ENCOUNTER — Ambulatory Visit: Payer: 59 | Attending: Otolaryngology

## 2019-08-20 DIAGNOSIS — H8112 Benign paroxysmal vertigo, left ear: Secondary | ICD-10-CM | POA: Diagnosis present

## 2019-08-20 DIAGNOSIS — R42 Dizziness and giddiness: Secondary | ICD-10-CM | POA: Insufficient documentation

## 2019-08-20 DIAGNOSIS — M542 Cervicalgia: Secondary | ICD-10-CM | POA: Diagnosis present

## 2019-08-20 DIAGNOSIS — R2689 Other abnormalities of gait and mobility: Secondary | ICD-10-CM | POA: Insufficient documentation

## 2019-08-20 NOTE — Therapy (Signed)
[facility] [address] [place], [place], [address] Phone: [phone]   Fax:  [phone]  Physical Therapy Evaluation  Patient Details  Name: Dennis Carrillo MRN: [id number] Date of Birth: [birth year] Referring Provider (PT): Dr. Izora Gala   Encounter Date: 08/20/2019  PT End of Session - 08/20/19 1202    Visit Number  1    Number of Visits  5    Date for PT Re-Evaluation  09/19/19    Authorization Type  [employer]    PT Start Time  1101    PT Stop Time  1154    PT Time Calculation (min)  53 min    Equipment Utilized During Treatment  Other (comment)   min guard to min A   Activity Tolerance  Patient tolerated treatment well    Behavior During Therapy  [facility] for tasks assessed/performed       Past Medical History:  Diagnosis Date  . Arthritis     Past Surgical History:  Procedure Laterality Date  . APPENDECTOMY    . 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 NECK SURGERY  08/2018   Excision soft tissue tumor neck-per pt's notes from Dr. Izora Gala  . WISDOM TOOTH EXTRACTION      There were no vitals filed for this visit.   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 Emelda Brothers performed the surgery. Since surgery, pt has experienced dizziness, which is worse when waking up (STS txf), bending down, lying down, looking up. At worst: 5/10, at rest: 0/10. Pt denied falls but reported unsteadiness and impaired balance during gait when dizziness present. Dizziness described as spinning and then wooziness, lasts for seconds. Pt had OPPT for a few months s/p Cx surgery denied precautions. Pt reported  tinnitus in R ear but denied LOH. Pt has hx of migraines since surgery and has received injections for cervical tension HAs. Pt denied falls, recent illness, or recent accidents. Pt reported he has hx of L knee pain, sees MD for pain.    Pertinent History  S/p excision soft tissue tumor in neck 08/2018, appendectomy 10 years ago    Patient Stated Goals  Start feeling better, get rid of dizziness    Currently in Pain?  Yes    Pain Score  5     Pain Location  Neck    Pain Orientation  Left   it was B but now only the L side hurts   Pain Descriptors / Indicators  Sharp    Pain Type  Chronic pain;Surgical pain    Pain Radiating Towards  L shoulder    Pain Onset  More than a month ago    Pain Frequency  Constant    Aggravating Factors   certain movement    Pain Relieving Factors  pain medication         [facility] PT Assessment - 08/20/19 1119      Assessment   Medical Diagnosis  Cervical vertigo    Referring Provider (PT)  Dr. Izora Gala    Onset Date/Surgical Date  08/19/18   surgery a year but pt does not remember date   Hand Dominance  Right    Prior  Therapy  OPPT ortho s/p neck surgery      Precautions   Precautions  Other (comment)   pt denied precautions s/p surgery   Precaution Comments  need to contact       Restrictions   Weight Bearing Restrictions  No      Balance Screen   Has the patient fallen in the past 6 months  No    Has the patient had a decrease in activity level because of a fear of falling?   Yes    Is the patient reluctant to leave their home because of a fear of falling?   No      Home Social worker  Private residence    Living Arrangements  Spouse/significant other;Children   38, 60, 37 years old   Available Help at Discharge  Available 24 hours/day    Type of [place] Access  Level entry    [place] - single point      Prior Function   Level of Independence  Independent    Vocation   Full time employment    [place]: run a machine (makes small rolls for now)    Leisure  play soccer, but has not played since surgery      Cognition   Overall Cognitive Status  Within Functional Limits for tasks assessed      Sensation   Additional Comments  Pt denied sensation issuess, stated N/T in hands prior to surgery but not since surgery.      Transfers   Transfers  Sit to Stand;Stand to Sit    Sit to Stand  7: Independent;Without upper extremity assist;From chair/3-in-1    Stand to Sit  Without upper extremity assist;7: Independent;To chair/3-in-1      Ambulation/Gait   Ambulation/Gait  Yes    Ambulation/Gait Assistance  7: Independent   guarded   Ambulation Distance (Feet)  100 Feet    Assistive device  None    Gait Pattern  Within Functional Limits;Step-through pattern    Ambulation Surface  Level;Indoor    Gait velocity  4.53f/sec. no AD      Functional Gait  Assessment   Gait assessed   Yes    Gait Level Surface  Walks 20 ft in less than 5.5 sec, no assistive devices, good speed, no evidence for imbalance, normal gait pattern, deviates no more than 6 in outside of the 12 in walkway width.   4.76sec.   Change in Gait Speed  Able to smoothly change walking speed without loss of balance or gait deviation. Deviate no more than 6 in outside of the 12 in walkway width.    Gait with Horizontal Head Turns  Performs head turns smoothly with no change in gait. Deviates no more than 6 in outside 12 in walkway width    Gait with Vertical Head Turns  Performs head turns with no change in gait. Deviates no more than 6 in outside 12 in walkway width.   with concordant dizziness   Gait and Pivot Turn  Pivot turns safely within 3 sec and stops quickly with no loss of balance.    Step Over Obstacle  Is able to step over 2 stacked shoe boxes taped together (9 in total height) without changing gait speed. No evidence of imbalance.    Gait with Narrow Base of  Support  Is able  to ambulate for 10 steps heel to toe with no staggering.    Gait with Eyes Closed  Walks 20 ft, no assistive devices, good speed, no evidence of imbalance, normal gait pattern, deviates no more than 6 in outside 12 in walkway width. Ambulates 20 ft in less than 7 sec.    Ambulating Backwards  Walks 20 ft, no assistive devices, good speed, no evidence for imbalance, normal gait    Steps  Alternating feet, no rail.    Total Score  30    FGA comment:              Vestibular Assessment - 08/20/19 1128      Symptom Behavior   Subjective history of current problem  See subjective assessment    Type of Dizziness   Spinning;Comment   and wooziness   Frequency of Dizziness  Daily    Duration of Dizziness  Seconds    Symptom Nature  Positional    Aggravating Factors  Looking up to the ceiling;Lying supine;Supine to sit;Rolling to left;Rolling to right;Forward bending    Relieving Factors  Slow movements;Rest;Closing eyes    Progression of Symptoms  Better   a little better     Oculomotor Exam   Oculomotor Alignment  Normal    Spontaneous  Absent    Gaze-induced   Absent    Smooth Pursuits  Intact   pt reported "eyes felt funny" but not concordant dizziness   Saccades  Intact   but reported R eye "felt funny"   Comment  Convergence (-)      Oculomotor Exam-Fixation Suppressed    Left Head Impulse  Negative    Right Head Impulse  Negative      Vestibulo-Ocular Reflex   VOR 1 Head Only (x 1 viewing)  WNL, pt did report slight dizziness during vertical head movements.      Positional Testing   Dix-Hallpike  Dix-Hallpike Right;Dix-Hallpike Left    Horizontal Canal Testing  Horizontal Canal Right;Horizontal Canal Left      Dix-Hallpike Right   Dix-Hallpike Right Duration  0    Dix-Hallpike Right Symptoms  No nystagmus      Dix-Hallpike Left   Dix-Hallpike Left Duration  <30 sec.    Dix-Hallpike Left Symptoms  Upbeat, left rotatory nystagmus      Horizontal  Canal Right   Horizontal Canal Right Duration  0    Horizontal Canal Right Symptoms  Normal      Horizontal Canal Left   Horizontal Canal Left Duration  0    Horizontal Canal Left Symptoms  Normal          Objective measurements completed on examination: See above findings.       Vestibular Treatment/Exercise - 08/20/19 1144      Vestibular Treatment/Exercise   Vestibular Treatment Provided  Canalith Repositioning    Canalith Repositioning  Epley Manuever Left       EPLEY MANUEVER LEFT   Number of Reps   2    Overall Response   Symptoms Worsened     RESPONSE DETAILS LEFT  Incr. dizziness.            PT Education - 08/20/19 1201    Education Details  PT encouraged pt to use Spanish interpreter as needed. PT educated pt on outcome measures, exam findings (BPPV), and PT POC, duration and frequency. PT also explained that pt could feel woozy/dizzy over the next 24-48 hours after testing and treatment.    Person(s)  Educated  Patient    Methods  Explanation;Other (comment)   computer images of BPPV   Comprehension  Verbalized understanding       PT Short Term Goals - 08/20/19 1212      PT SHORT TERM GOAL #1   Title  same as LTGs        PT Long Term Goals - 08/20/19 1212      PT LONG TERM GOAL #1   Title  Pt will report 0/10 dizziness during all activities (bed mobility, looking up, down, turning). TARGET DATE FOR ALL LTGS: 09/17/19    Baseline  5/10 at worst    Time  4    Period  Weeks    Status  New      PT LONG TERM GOAL #2   Title  Assess pt's Cx spine and write goals as indicated. (once we receive notes from surgeon)    Time  4    Period  Weeks    Status  New      PT LONG TERM GOAL #3   Title  Continue to monitor balance and treat if impaired.    Baseline  30/30 on FGA    Time  4    Period  Weeks    Status  New             Plan - 08/20/19 1204    Clinical Impression Statement  Pt is a pleasant 33y/o male presenting to OPPT neuro for  dizziness s/p 08/2018 surgical excision of soft tissues tumor (poterior neck). PT did request pt have surgeon send notes indicating if pt has precautions. Pt's PMH significant for the following: s/p 08/2018 surgical excision of soft tissues tumor (poterior neck). The patient experience concordant dizzziness during L Dix-hallpike with L upbeating rotary nystagmus, s/s consistent with L pBPPV. Pt reported incr. in wooziness s/p two Epley treatments, which subsided after a few minutes. Pt's dizziness likely multi-factorial as he reported he's been guarding his neck for the last year, which could impact vestibular system. PT will formally assess Cx ROM next session. Pt's gait speed and FGA were WNL and indicated pt is not at risk for falls, therefore, PT will focus on treating dizziness and Cx spine as decr. ROM and weakness suspected 2/2guarding (once we receive notes from surgeon indicating no precautions). Pt would benefit from skilled PT to improve dizziness to improve safety and QOL during functional mobility.    Personal Factors and Comorbidities  Comorbidity 1;Time since onset of injury/illness/exacerbation;Past/Current Experience    Examination-Activity Limitations  Bed Mobility;Bend;Caring for Others;Carry;Locomotion Level;Transfers;Reach Overhead;Squat;Stand    Examination-Participation Restrictions  Driving;Interpersonal Relationship;Other   work   Stability/Clinical Decision Making  Stable/Uncomplicated    Clinical Decision Making  Low    Rehab Potential  Good    PT Frequency  1x / week    PT Duration  4 weeks    PT Treatment/Interventions  ADLs/Self Care Home Management;Canalith Repostioning;DME Instruction;Balance training;Therapeutic exercise;Therapeutic activities;Functional mobility training;Stair training;Gait training;Neuromuscular re-education;Patient/family education;Vestibular;Passive range of motion;Manual techniques    PT Next Visit Plan  Reassess for L pBPPV and treat as indicated.  Assess Cx spine strength and ROM once we receive notes from surgeon confirming no precautions    Consulted and Agree with Plan of Care  Patient       Patient will benefit from skilled therapeutic intervention in order to improve the following deficits and impairments:  Abnormal gait, Decreased mobility, Decreased strength, Decreased balance, Decreased knowledge of use of DME,  Dizziness, Decreased range of motion  Visit Diagnosis: BPPV (benign paroxysmal positional vertigo), left - Plan: PT plan of care cert/re-cert  Dizziness and giddiness - Plan: PT plan of care cert/re-cert  Other abnormalities of gait and mobility - Plan: PT plan of care cert/re-cert  Cervicalgia - Plan: PT plan of care cert/re-cert     Problem List Patient Active Problem List   Diagnosis Date Noted  . 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 [place], [place], [address] Phone: [phone]   Fax:  [phone]  Name: Dennis Carrillo MRN: [id number] Date of Birth: 06-01-1986   Geoffry Paradise, PT,DPT 08/20/19 12:17 PM Phone: [phone] Fax: [phone]

## 2019-09-17 ENCOUNTER — Encounter: Payer: Self-pay | Admitting: Physical Therapy

## 2019-09-17 ENCOUNTER — Other Ambulatory Visit: Payer: Self-pay

## 2019-09-17 ENCOUNTER — Ambulatory Visit: Payer: 59 | Attending: Otolaryngology | Admitting: Physical Therapy

## 2019-09-17 DIAGNOSIS — H8112 Benign paroxysmal vertigo, left ear: Secondary | ICD-10-CM | POA: Diagnosis present

## 2019-09-17 DIAGNOSIS — R2689 Other abnormalities of gait and mobility: Secondary | ICD-10-CM | POA: Insufficient documentation

## 2019-09-17 DIAGNOSIS — R42 Dizziness and giddiness: Secondary | ICD-10-CM | POA: Diagnosis present

## 2019-09-17 NOTE — Patient Instructions (Signed)
Access Code: [email or web address]  URL: [email or web address]  Date: 09/17/2019  Prepared by: Misty Stanley   Exercises Seated Gaze Stabilization with Head Rotation - 2 sets - 60 seconds hold - 1x daily - 7x weekly Seated Gaze Stabilization with Head Nod - 2 sets - 60 seconds hold - 1x daily - 7x weekly

## 2019-09-17 NOTE — Therapy (Signed)
[facility] [address] [place], [place], [address] Phone: [phone]   Fax:  [phone]  Physical Therapy Treatment  Patient Details  Name: Dennis Carrillo MRN: [id number] Date of Birth: [birth year] Referring Provider (PT): Dr. Izora Gala   Encounter Date: 09/17/2019  PT End of Session - 09/17/19 1250    Visit Number  2    Number of Visits  5    Date for PT Re-Evaluation  10/08/19   extended by 3 weeks due to delay in follow up appointment   Authorization Type  [employer]    PT Start Time  1022    PT Stop Time  1059    PT Time Calculation (min)  37 min    Equipment Utilized During Treatment  Other (comment)   min guard to min A   Activity Tolerance  Patient tolerated treatment well    Behavior During Therapy  [facility] for tasks assessed/performed       Past Medical History:  Diagnosis Date  . Arthritis     Past Surgical History:  Procedure Laterality Date  . APPENDECTOMY    . 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 NECK SURGERY  08/2018   Excision soft tissue tumor neck-per pt's notes from Dr. Izora Gala  . WISDOM TOOTH EXTRACTION      There were no vitals filed for this visit.  Subjective Assessment - 09/17/19 1023    Subjective  Dizziness is a little better. No other changes.    Pertinent History  S/p excision soft tissue tumor in neck 08/2018, appendectomy 10 years ago    Patient Stated Goals  Start feeling better, get rid of dizziness    Currently in Pain?  No/denies    Pain Onset  More than a month ago             Vestibular Assessment - 09/17/19 1025      Positional Testing   Dix-Hallpike  Dix-Hallpike Left      Dix-Hallpike Left   Dix-Hallpike Left  Duration  >1 minute    Dix-Hallpike Left Symptoms  Upbeat, left rotatory nystagmus                Vestibular Treatment/Exercise - 09/17/19 1031      Vestibular Treatment/Exercise   Vestibular Treatment Provided  Canalith Repositioning;Gaze    Canalith Repositioning  Epley Manuever Left;Semont Procedure Left Posterior    Gaze Exercises  X1 Viewing Horizontal;X1 Viewing Vertical       EPLEY MANUEVER LEFT   Number of Reps   2    Overall Response   Symptoms Resolved     RESPONSE DETAILS LEFT  following L hallpike-dix assessment but nystagmus lasting >1 minute so changed to Semont procedure      Semont Procedure Left Posterior   Number of Reps   1    Overall Response  Symptoms Resolved    Response Details   no pain in neck with manuever.  After first manuever - reassessed with L hallpike dix      X1 Viewing Horizontal   Foot Position  seated without back support    Comments  1 minute; mild symptoms      X1 Viewing Vertical   Foot Position  seated without back support  Comments  1 minute, mild symptoms but reported letter was moving            PT Education - 09/17/19 1249    Education Details  ongoing BPPV education and x1 viewing    Person(s) Educated  Patient    Methods  Explanation;Demonstration;Handout    Comprehension  Verbalized understanding;Returned demonstration       PT Short Term Goals - 08/20/19 1212      PT SHORT TERM GOAL #1   Title  same as LTGs        PT Long Term Goals - 09/17/19 1255      PT LONG TERM GOAL #1   Title  Pt will report 0/10 dizziness during all activities (bed mobility, looking up, down, turning). TARGET DATE FOR ALL LTGS: 10/08/2019, target date extended due to delay in follow up visits after evaluation (4 week delay).    Baseline  5/10 at worst    Time  4    Period  Weeks    Status  New      PT LONG TERM GOAL #2   Title  Assess pt's Cx spine and write goals as indicated. (once we receive notes from surgeon)     Baseline  no ROM limitations noted    Time  4    Period  Weeks    Status  Deferred      PT LONG TERM GOAL #3   Title  Continue to monitor balance and treat if impaired.    Baseline  30/30 on FGA    Time  4    Period  Weeks    Status  New      PT LONG TERM GOAL #4   Title  Pt will demonstrate ability to perform VOR x1 viewing in standing x 2 minutes each without any symptoms of dizziness    Time  4    Period  Weeks            Plan - 09/17/19 1251    Clinical Impression Statement  Pt continued to present with nystagmus and vertigo with L hallpike-dix test that did not change with CRM but continued to demonstrate nystagmus >1 minute.  Treated for cupulolithiasis with Semont manuever and then one more CRM with resolution of dizziness.  Due to pt having vertigo x 1 year initiated x1 viewing for gaze adaptation and VOR training.  Pt return demonstrated.  Will continue to progress towards LTG.    Personal Factors and Comorbidities  Comorbidity 1;Time since onset of injury/illness/exacerbation;Past/Current Experience    Examination-Activity Limitations  Bed Mobility;Bend;Caring for Others;Carry;Locomotion Level;Transfers;Reach Overhead;Squat;Stand    Examination-Participation Restrictions  Driving;Interpersonal Relationship;Other   work   Stability/Clinical Decision Making  Stable/Uncomplicated    Rehab Potential  Good    PT Frequency  1x / week    PT Duration  4 weeks    PT Treatment/Interventions  ADLs/Self Care Home Management;Canalith Repostioning;DME Instruction;Balance training;Therapeutic exercise;Therapeutic activities;Functional mobility training;Stair training;Gait training;Neuromuscular re-education;Patient/family education;Vestibular;Passive range of motion;Manual techniques    PT Next Visit Plan  Reassess for L pBPPV and treat as indicated. Progress x1 viewing from sitting to standing.  Balance as needed.  Habituation as needed    Consulted and Agree with Plan of Care   Patient       Patient will benefit from skilled therapeutic intervention in order to improve the following deficits and impairments:  Abnormal gait, Decreased mobility, Decreased strength, Decreased balance, Decreased knowledge of use of DME, Dizziness, Decreased range  of motion  Visit Diagnosis: BPPV (benign paroxysmal positional vertigo), left  Dizziness and giddiness  Other abnormalities of gait and mobility     Problem List Patient Active Problem List   Diagnosis Date Noted  . 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 [place], [place], [address] Phone: [phone]   Fax:  [phone]  Name: Niklas Fairfield MRN: [id number] Date of Birth: 1986/08/10

## 2019-09-24 ENCOUNTER — Other Ambulatory Visit: Payer: Self-pay

## 2019-09-24 ENCOUNTER — Ambulatory Visit: Payer: 59 | Admitting: Physical Therapy

## 2019-09-24 ENCOUNTER — Encounter: Payer: Self-pay | Admitting: Physical Therapy

## 2019-09-24 DIAGNOSIS — H8112 Benign paroxysmal vertigo, left ear: Secondary | ICD-10-CM | POA: Diagnosis not present

## 2019-09-24 DIAGNOSIS — R2689 Other abnormalities of gait and mobility: Secondary | ICD-10-CM

## 2019-09-24 DIAGNOSIS — R42 Dizziness and giddiness: Secondary | ICD-10-CM

## 2019-09-24 NOTE — Therapy (Signed)
[facility] [address] [place], [place], [address] Phone: [phone]   Fax:  [phone]  Physical Therapy Treatment  Patient Details  Name: Dennis Carrillo MRN: [id number] Date of Birth: 1986-04-04 Referring Provider (PT): Dr. Izora Gala   Encounter Date: 09/24/2019  PT End of Session - 09/24/19 0936    Visit Number  3    Number of Visits  5    Date for PT Re-Evaluation  10/08/19   extended by 3 weeks due to delay in follow up appointment   Authorization Type  [employer]    PT Start Time  [phone]    PT Stop Time  1020    PT Time Calculation (min)  44 min    Equipment Utilized During Treatment  Other (comment)   min guard to min A   Activity Tolerance  Patient tolerated treatment well    Behavior During Therapy  [facility] for tasks assessed/performed       Past Medical History:  Diagnosis Date  . Arthritis     Past Surgical History:  Procedure Laterality Date  . APPENDECTOMY    . 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 NECK SURGERY  08/2018   Excision soft tissue tumor neck-per pt's notes from Dr. Izora Gala  . WISDOM TOOTH EXTRACTION      There were no vitals filed for this visit.  Subjective Assessment - 09/24/19 0935    Subjective  Dizziness has stayed about the same since last treatment.    Pertinent History  S/p excision soft tissue tumor in neck 08/2018, appendectomy 10 years ago    Patient Stated Goals  Start feeling better, get rid of dizziness    Currently in Pain?  No/denies    Pain Onset  More than a month ago             Vestibular Assessment - 09/24/19 0946      Positional Testing   Dix-Hallpike  Dix-Hallpike Left      Dix-Hallpike Left   Dix-Hallpike Left Duration  <60s     Dix-Hallpike Left Symptoms  Upbeat, left rotatory nystagmus                Vestibular Treatment/Exercise - 09/24/19 0952      Vestibular Treatment/Exercise   Vestibular Treatment Provided  Canalith Repositioning;Gaze    Canalith Repositioning  Epley Manuever Left    Gaze Exercises  X1 Viewing Horizontal;X1 Viewing Vertical       EPLEY MANUEVER LEFT   Number of Reps   3    Overall Response   Improved Symptoms     RESPONSE DETAILS LEFT  pt continued to have nystagmus after each treatment however shorter in duration (<60 sec, 50 sec, 28 sec) with reports of less intense dizziness symptoms      X1 Viewing Horizontal   Foot Position  seated without back support x2, standing x1    Comments  seated: x60 sec mild symptoms, x90 sec no symptoms; standing: x30s no symptoms reported      X1 Viewing Vertical   Foot Position  seated without back support x2, standing x1    Comments  seated: x60 sec mild symptoms, x90 sec no symptoms; standing: x30s no symptoms reported   more intense dizziness compared but still  mild        Balance Exercises - 09/24/19 1028      Balance Exercises: Standing   Standing Eyes Opened  Narrow base of support (BOS);Head turns;Solid surface   x10, mild dizziness   Standing Eyes Closed  Narrow base of support (BOS);Foam/compliant surface;3 reps;30 secs   posterior swaying, no LOB       PT Education - 09/24/19 1011    Education Details  progressed HEP VORx1 standing x1 minute, eyes closed on complaint surface static balance x30 sec    Person(s) Educated  Patient    Methods  Explanation;Handout;Verbal cues    Comprehension  Verbalized understanding;Returned demonstration       PT Short Term Goals - 08/20/19 1212      PT SHORT TERM GOAL #1   Title  same as LTGs        PT Long Term Goals - 09/17/19 1255      PT LONG TERM GOAL #1   Title  Pt will report 0/10 dizziness during all activities (bed mobility,  looking up, down, turning). TARGET DATE FOR ALL LTGS: 10/08/2019, target date extended due to delay in follow up visits after evaluation (4 week delay).    Baseline  5/10 at worst    Time  4    Period  Weeks    Status  New      PT LONG TERM GOAL #2   Title  Assess pt's Cx spine and write goals as indicated. (once we receive notes from surgeon)    Baseline  no ROM limitations noted    Time  4    Period  Weeks    Status  Deferred      PT LONG TERM GOAL #3   Title  Continue to monitor balance and treat if impaired.    Baseline  30/30 on FGA    Time  4    Period  Weeks    Status  New      PT LONG TERM GOAL #4   Title  Pt will demonstrate ability to perform VOR x1 viewing in standing x 2 minutes each without any symptoms of dizziness    Time  4    Period  Weeks            Plan - 09/24/19 1023    Clinical Impression Statement  Patient presented with  nystagmus and reported dizziness with L Dix-Hallpike test that improved with CRM. With each Epley treatment, patient's nystagmus reduced in duration and symptoms continued to decrease as well although still mild. Progressed x1 viewing for gaze adaptation to standing x30s with no reports of symptoms. Initiated narrow BOS standing balance exercise. He will continue to progress towards LTGs.    Personal Factors and Comorbidities  Comorbidity 1;Time since onset of injury/illness/exacerbation;Past/Current Experience    Examination-Activity Limitations  Bed Mobility;Bend;Caring for Others;Carry;Locomotion Level;Transfers;Reach Overhead;Squat;Stand    Examination-Participation Restrictions  Driving;Interpersonal Relationship;Other   work   Stability/Clinical Decision Making  Stable/Uncomplicated    Rehab Potential  Good    PT Frequency  1x / week    PT Duration  4 weeks    PT Treatment/Interventions  ADLs/Self Care Home Management;Canalith Repostioning;DME Instruction;Balance training;Therapeutic exercise;Therapeutic activities;Functional  mobility training;Stair training;Gait training;Neuromuscular re-education;Patient/family education;Vestibular;Passive range of motion;Manual techniques    PT Next Visit Plan  Check LTGs. Reassess for L pBPPV and treat as indicated. Progress x1 viewing in duration/compliant surface/busy background.  Balance as needed.  Habituation as needed    Consulted and  Agree with Plan of Care  Patient       Patient will benefit from skilled therapeutic intervention in order to improve the following deficits and impairments:  Abnormal gait, Decreased mobility, Decreased strength, Decreased balance, Decreased knowledge of use of DME, Dizziness, Decreased range of motion  Visit Diagnosis: BPPV (benign paroxysmal positional vertigo), left  Dizziness and giddiness  Other abnormalities of gait and mobility     Problem List Patient Active Problem List   Diagnosis Date Noted  . 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 [place], [place], [address] Phone: [phone]   Fax:  [phone]  Name: Dennis Carrillo MRN: [id number] Date of Birth: 1985/11/30

## 2019-09-24 NOTE — Patient Instructions (Signed)
Access Code: [email or web address]  URL: [email or web address]  Date: 09/24/2019  Prepared by: Misty Stanley   Exercises Standing Gaze Stabilization with Head Nod - 2 sets - 60 second hold - 1x daily - 7x weekly Standing Gaze Stabilization with Head Rotation - 2 sets - 60 second hold - 1x daily - 7x weekly Narrow Base of Support Standing Balance Eyes Closed on Foam Pad - 3 sets - 30 seconds hold - 1x daily - 7x weekly

## 2019-10-15 ENCOUNTER — Ambulatory Visit: Payer: 59 | Admitting: Physical Therapy

## 2019-10-20 ENCOUNTER — Other Ambulatory Visit: Payer: Self-pay | Admitting: Neurological Surgery

## 2019-10-20 DIAGNOSIS — D492 Neoplasm of unspecified behavior of bone, soft tissue, and skin: Secondary | ICD-10-CM

## 2019-10-22 ENCOUNTER — Ambulatory Visit: Payer: 59 | Admitting: Physical Therapy

## 2019-11-19 ENCOUNTER — Other Ambulatory Visit: Payer: Self-pay

## 2019-11-19 ENCOUNTER — Ambulatory Visit
Admission: RE | Admit: 2019-11-19 | Discharge: 2019-11-19 | Disposition: A | Discharge status: Home / Self Care | Payer: 59 | Source: Ambulatory Visit | Attending: Neurological Surgery | Admitting: Neurological Surgery

## 2019-11-19 ENCOUNTER — Other Ambulatory Visit: Payer: 59

## 2019-11-19 DIAGNOSIS — D492 Neoplasm of unspecified behavior of bone, soft tissue, and skin: Secondary | ICD-10-CM

## 2019-11-19 MED ORDER — GADOBENATE DIMEGLUMINE 529 MG/ML IV SOLN
15.0000 mL | Freq: Once | INTRAVENOUS | Status: AC | PRN
  Administered 2019-11-19: 15:00:00 15 mL via INTRAVENOUS

## 2019-12-23 ENCOUNTER — Encounter (HOSPITAL_COMMUNITY): Payer: Self-pay

## 2019-12-23 ENCOUNTER — Emergency Department (HOSPITAL_COMMUNITY)
Admission: EM | Admit: 2019-12-23 | Discharge: 2019-12-23 | Disposition: A | Discharge status: Home / Self Care | Payer: 59 | Attending: Emergency Medicine | Admitting: Emergency Medicine

## 2019-12-23 ENCOUNTER — Emergency Department (HOSPITAL_COMMUNITY): Payer: 59

## 2019-12-23 DIAGNOSIS — R519 Headache, unspecified: Secondary | ICD-10-CM | POA: Insufficient documentation

## 2019-12-23 DIAGNOSIS — Z79899 Other long term (current) drug therapy: Secondary | ICD-10-CM | POA: Diagnosis not present

## 2019-12-23 LAB — CBC WITH DIFFERENTIAL/PLATELET
Abs Immature Granulocytes: 0.02 10*3/uL (ref 0.00–0.07)
Basophils Absolute: 0 10*3/uL (ref 0.0–0.1)
Basophils Relative: 0 %
Eosinophils Absolute: 0.1 10*3/uL (ref 0.0–0.5)
Eosinophils Relative: 1 %
HCT: 47.7 % (ref 39.0–52.0)
Hemoglobin: 16.6 g/dL (ref 13.0–17.0)
Immature Granulocytes: 0 %
Lymphocytes Relative: 39 %
Lymphs Abs: 3 10*3/uL (ref 0.7–4.0)
MCH: 29 pg (ref 26.0–34.0)
MCHC: 34.8 g/dL (ref 30.0–36.0)
MCV: 83.4 fL (ref 80.0–100.0)
Monocytes Absolute: 0.5 10*3/uL (ref 0.1–1.0)
Monocytes Relative: 7 %
Neutro Abs: 4 10*3/uL (ref 1.7–7.7)
Neutrophils Relative %: 53 %
Platelets: 265 10*3/uL (ref 150–400)
RBC: 5.72 MIL/uL (ref 4.22–5.81)
RDW: 12.7 % (ref 11.5–15.5)
WBC: 7.7 10*3/uL (ref 4.0–10.5)
nRBC: 0 % (ref 0.0–0.2)

## 2019-12-23 LAB — COMPREHENSIVE METABOLIC PANEL
ALT: 53 U/L — ABNORMAL HIGH (ref 0–44)
AST: 34 U/L (ref 15–41)
Albumin: 4.6 g/dL (ref 3.5–5.0)
Alkaline Phosphatase: 62 U/L (ref 38–126)
Anion gap: 11 (ref 5–15)
BUN: 5 mg/dL — ABNORMAL LOW (ref 6–20)
CO2: 25 mmol/L (ref 22–32)
Calcium: 9.3 mg/dL (ref 8.9–10.3)
Chloride: 107 mmol/L (ref 98–111)
Creatinine, Ser: 0.79 mg/dL (ref 0.61–1.24)
GFR calc Af Amer: >60 mL/min (ref >60)
GFR calc non Af Amer: >60 mL/min (ref >60)
Glucose, Bld: 103 mg/dL — ABNORMAL HIGH (ref 70–99)
Potassium: 3.6 mmol/L (ref 3.5–5.1)
Sodium: 143 mmol/L (ref 135–145)
Total Bilirubin: 0.8 mg/dL (ref 0.3–1.2)
Total Protein: 7.5 g/dL (ref 6.5–8.1)

## 2019-12-23 MED ORDER — DEXAMETHASONE SODIUM PHOSPHATE 10 MG/ML IJ SOLN
10.0000 mg | Freq: Once | INTRAMUSCULAR | Status: AC
  Administered 2019-12-23: 05:00:00 10 mg via INTRAVENOUS
  Filled 2019-12-23: qty 1

## 2019-12-23 MED ORDER — LACTATED RINGERS IV BOLUS
1000.0000 mL | Freq: Once | INTRAVENOUS | Status: AC
  Administered 2019-12-23: 1000 mL via INTRAVENOUS

## 2019-12-23 MED ORDER — DIPHENHYDRAMINE HCL 50 MG/ML IJ SOLN
25.0000 mg | Freq: Once | INTRAMUSCULAR | Status: AC
  Administered 2019-12-23: 25 mg via INTRAVENOUS
  Filled 2019-12-23: qty 1

## 2019-12-23 MED ORDER — PROCHLORPERAZINE EDISYLATE 10 MG/2ML IJ SOLN
10.0000 mg | Freq: Once | INTRAMUSCULAR | Status: AC
  Administered 2019-12-23: 10 mg via INTRAVENOUS
  Filled 2019-12-23: qty 2

## 2019-12-23 NOTE — ED Triage Notes (Signed)
Pt states that he has a headache that has been going on for a week and is getting worse, photosensitivity and nausea.

## 2019-12-23 NOTE — ED Notes (Signed)
Pt was discharged from the ED. Pt read and understood discharge paperwork. Pt had vital signs completed. Pt conscious, breathing, and A&Ox4. No distress noted. Pt speaking in complete sentences. Pt ambulated out of the ED with a smooth and steady gait. E-signature not available.  

## 2019-12-23 NOTE — ED Provider Notes (Signed)
Emergency Department Provider Note   I have reviewed the triage vital signs and the nursing notes.   HISTORY  Chief Complaint Headache   HPI Dennis Carrillo is a 34 y.o. male with medical problems documented below who presents to the emergency department today secondary to headache.  Patient states he has had worsening headaches for about the last year.  States initially over-the-counter medications did help but now they are not helping as much.  States that it is throbbing located through entirety of the top of his head.  Has photosensitivity and nausea with it as well.  No fevers.  No new neck pain.  No other associated symptoms.   No other associated or modifying symptoms.    Past Medical History:  Diagnosis Date  . Arthritis     Patient Active Problem List   Diagnosis Date Noted  . 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 APPENDECTOMY    . 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 NECK SURGERY  08/2018   Excision soft tissue tumor neck-per pt's notes from Dr. Izora Gala  . WISDOM TOOTH EXTRACTION      Current Outpatient Rx  . Order #: [id number] Class: Historical Med  . Order #: [id number] Class: Print  . Order #: [id number] Class: Print  . Order #: [id number] Class: Print  . Order #: [id number] Class: Historical Med  . Order #: [id number] Class: Print  . Order #: [id number] Class: Print    Allergies Patient has no known allergies.  No family history on file.  Social History Social History   Tobacco Use  . Smoking status: Never Smoker  . Smokeless tobacco: Never Used  Substance Use Topics  . Alcohol use: Not Currently  . Drug use: Never     Review of Systems  All other systems negative except as documented in the HPI. All pertinent positives and negatives as reviewed in the HPI. ____________________________________________   PHYSICAL EXAM:  VITAL SIGNS: ED Triage Vitals [12/23/19 0147]  Enc Vitals Group     BP (!) 137/97     Pulse Rate 92     Resp 16     Temp 98.2 F (36.8 C)     Temp Source Oral     SpO2 100 %    Constitutional: Alert and oriented. Well appearing and in no acute distress. Eyes: Conjunctivae are normal. PERRL. EOMI. Head: Atraumatic. Nose: No congestion/rhinnorhea. Mouth/Throat: Mucous membranes are moist.  Oropharynx non-erythematous. Neck: No stridor.  No meningeal signs.   Cardiovascular: Normal rate, regular rhythm. Good peripheral circulation. Grossly normal heart sounds.   Respiratory: Normal respiratory effort.  No retractions. Lungs CTAB. Gastrointestinal: Soft and nontender. No distention.  /musculoskeletal: No lower extremity tenderness nor edema. No gross deformities of extremities. Neurologic:  No altered mental status, able to give full seemingly accurate history.  Face is symmetric, EOM's intact, pupils equal and reactive, vision intact, tongue and uvula midline without deviation. Upper and Lower extremity motor 5/5, intact pain perception in distal extremities, 2+ reflexes in biceps, patella and achilles tendons. Able to perform finger to nose normal with both hands. Walks without assistance or evident ataxia.  Skin:  Skin is warm, dry and intact. No rash noted.  ____________________________________________   Reva Bores (  all labs ordered are listed, but only abnormal results are displayed)  Labs Reviewed  COMPREHENSIVE METABOLIC PANEL - Abnormal; Notable for the following components:      Result Value   Glucose, Bld 103 (*)    BUN 5 (*)    ALT 53 (*)    All other components within normal limits  CBC WITH DIFFERENTIAL/PLATELET    ____________________________________________   RADIOLOGY  CT Head Wo Contrast  Result Date: 12/23/2019 CLINICAL DATA:  Worsening headache for 1 week with photosensitivity and nausea. EXAM: CT HEAD WITHOUT CONTRAST TECHNIQUE: Contiguous axial images were obtained from the base of the skull through the vertex without intravenous contrast. COMPARISON:  None. FINDINGS: Brain: No evidence of acute infarction, hemorrhage, hydrocephalus, extra-axial collection or mass lesion/mass effect. Patchy areas of subcortical and deep white matter hypoattenuation are noted in both cerebral hemispheres. Parenchymal volume is within normal limits. Vascular: No hyperdense vessel or unexpected calcification. Skull: No calvarial fracture or suspicious osseous lesion. No scalp swelling or hematoma. Sinuses/Orbits: Paranasal sinuses and mastoid air cells are predominantly clear. Included orbital structures are unremarkable. Other: None IMPRESSION: 1. No acute intracranial abnormality. 2. Patchy areas of subcortical and deep white matter hypoattenuation are noted in both cerebral hemispheres. This is a nonspecific finding in a patient of this age the differential is quite broad including sequela of migraine, demyelinating disease, atypical infection such as Lyme disease, as well as microvascular angiopathy changes. Electronically Signed   By: Lovena Le M.D.   On: 12/23/2019 04:37    ____________________________________________  INITIAL IMPRESSION / ASSESSMENT AND PLAN / ED COURSE  Signs symptoms seem consistent with migraine however with worsening and severity of the headache that he describes will CT to make sure there is no new space-occupying lesions in there. Treat HA otherwise. Will need outpatient neurology follow up.  Ha improved. Neuro intact. Slightly abnormal ct, will need neuro follow up and likely MRI. Also fu for headaches.    Pertinent labs & imaging results that were available during my care of the  patient were reviewed by me and considered in my medical decision making (see chart for details).  A medical screening exam was performed and I feel the patient has had an appropriate workup for their chief complaint at this time and likelihood of emergent condition existing is low. They have been counseled on decision, discharge, follow up and which symptoms necessitate immediate return to the emergency department. They or their family verbally stated understanding and agreement with plan and discharged in stable condition.   ____________________________________________  FINAL CLINICAL IMPRESSION(S) / ED DIAGNOSES  Final diagnoses:  Nonintractable headache, unspecified chronicity pattern, unspecified headache type    MEDICATIONS GIVEN DURING THIS VISIT:  Medications  prochlorperazine (COMPAZINE) injection 10 mg (10 mg Intravenous Given 12/23/19 0453)  diphenhydrAMINE (BENADRYL) injection 25 mg (25 mg Intravenous Given 12/23/19 0452)  lactated ringers bolus 1,000 mL (0 mLs Intravenous Stopped 12/23/19 0544)  dexamethasone (DECADRON) injection 10 mg (10 mg Intravenous Given 12/23/19 0452)     NEW OUTPATIENT MEDICATIONS STARTED DURING THIS VISIT:  New Prescriptions   No medications on file    Note:  This note was prepared with assistance of Dragon voice recognition software. Occasional wrong-word or sound-a-like substitutions may have occurred due to the inherent limitations of voice recognition software.   Bianna Haran, Corene Cornea, MD 12/23/19 [phone]

## 2019-12-28 ENCOUNTER — Other Ambulatory Visit: Payer: Self-pay | Admitting: Neurological Surgery

## 2019-12-28 DIAGNOSIS — S129XXA Fracture of neck, unspecified, initial encounter: Secondary | ICD-10-CM

## 2020-01-14 ENCOUNTER — Ambulatory Visit
Admission: RE | Admit: 2020-01-14 | Discharge: 2020-01-14 | Disposition: A | Discharge status: Home / Self Care | Payer: 59 | Source: Ambulatory Visit | Attending: Neurological Surgery | Admitting: Neurological Surgery

## 2020-01-14 DIAGNOSIS — S129XXA Fracture of neck, unspecified, initial encounter: Secondary | ICD-10-CM

## 2020-02-04 ENCOUNTER — Other Ambulatory Visit: Payer: Self-pay

## 2020-02-04 ENCOUNTER — Ambulatory Visit: Payer: 59 | Admitting: Diagnostic Neuroimaging

## 2020-02-04 ENCOUNTER — Encounter: Payer: Self-pay | Admitting: Diagnostic Neuroimaging

## 2020-02-04 ENCOUNTER — Telehealth: Payer: Self-pay | Admitting: Diagnostic Neuroimaging

## 2020-02-04 VITALS — BP 122/85 | HR 84 | Temp 97.0°F | Ht 63.0 in | Wt 179.0 lb

## 2020-02-04 DIAGNOSIS — G43109 Migraine with aura, not intractable, without status migrainosus: Secondary | ICD-10-CM

## 2020-02-04 DIAGNOSIS — G4452 New daily persistent headache (NDPH): Secondary | ICD-10-CM

## 2020-02-04 MED ORDER — AMITRIPTYLINE HCL 25 MG PO TABS: 25.0000 mg | ORAL_TABLET | Freq: Every day | ORAL | 12 refills | Status: DC

## 2020-02-04 MED ORDER — RIZATRIPTAN BENZOATE 10 MG PO TBDP
10.0000 mg | ORAL_TABLET | ORAL | 11 refills | Status: DC | PRN
Start: 2020-02-04 — End: 2024-04-13

## 2020-02-04 MED ORDER — NURTEC 75 MG PO TBDP: 75.0000 mg | ORAL_TABLET | Freq: Every day | ORAL | 6 refills | Status: DC | PRN

## 2020-02-04 MED ORDER — AIMOVIG 70 MG/ML ~~LOC~~ SOAJ
70.0000 mg | SUBCUTANEOUS | 4 refills | Status: DC
Start: 2020-02-04 — End: 2024-04-13

## 2020-02-04 NOTE — Patient Instructions (Signed)
MIGRAINE PREVENTION  -Stop or avoid smoking -Decrease or avoid caffeine / alcohol -Eat and sleep on a regular schedule -Exercise several times per week - start amitriptyline 25mg  at bedtime - start erenumab (Aimovig) 70mg  monthly  MIGRAINE RESCUE  - ibuprofen, tylenol as needed - rizatriptan (Maxalt) 10mg  as needed for breakthrough headache; may repeat x 1 after 2 hours; max 2 tabs per day or 8 per month - rimegepant (Nurtec) 75mg  as needed for breakthrough headache; max 8 per month

## 2020-02-04 NOTE — Telephone Encounter (Signed)
[employer] order sent to [facility]. They will obtain the auth and reach out to the patient to schedule.  °

## 2020-02-04 NOTE — Progress Notes (Signed)
[facility]  PATIENT: Dennis Carrillo DOB: 1986-05-30  REFERRING CLINICIAN: Mesner, Corene Cornea, MD HISTORY FROM: patient  REASON FOR VISIT: new consult    HISTORICAL  CHIEF COMPLAINT:  Chief Complaint  Patient presents with  . Referral    Referral fro headaches internal pt had surgery on neck in 2019 , and started developing headaches with wife ANguiano  pt has tried topamax and imitrex in past but it has not help his headaches     HISTORY OF PRESENT ILLNESS:   34 year old male here for evaluation of headaches.  Symptoms started about 2 years ago with bilateral throbbing severe headaches, photophobia, phonophobia, nausea vomiting.  Headaches can last 1 week up to 1 month at a time.  Sometimes he sees visual aura with spots and sparkles.  He was tried on topiramate and Imitrex without relief.  He tried this a few months ago for 1 month duration.  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  He underwent surgical decompression and has done well since that time.  Takes some medication for neck pain as needed.   REVIEW OF SYSTEMS: Full 14 system review of systems performed and negative with exception of: as per HPI.   ALLERGIES: No Known Allergies  HOME MEDICATIONS: Outpatient Medications Prior to Visit  Medication Sig Dispense Refill  . acetaminophen (TYLENOL) 500 MG tablet Take 500 mg by mouth every 6 (six) hours as needed.    Marland Kitchen aspirin-acetaminophen-caffeine (EXCEDRIN MIGRAINE) 250-250-65 MG tablet Take 1 tablet by mouth every 6 (six) hours as needed for headache.    . diazepam (VALIUM) 10 MG tablet Take 1 tablet (10 mg total) by mouth every 6 (six) hours as needed. 30 tablet 0  . docusate sodium (COLACE) 100 MG capsule Take 1 capsule (100 mg total) by mouth 2 (two) times daily. 10 capsule 0  . gabapentin (NEURONTIN) 300 MG capsule Take 1 capsule (300 mg  total) by mouth 3 (three) times daily. 30 capsule 2  . ibuprofen (ADVIL) 200 MG tablet Take 200 mg by mouth every 6 (six) hours as needed.    . traMADol (ULTRAM) 50 MG tablet Take 50 mg by mouth every 6 (six) hours.    Marland Kitchen levofloxacin (LEVAQUIN) 750 MG tablet Take 750 mg by mouth daily.  0  . LORazepam (ATIVAN) 1 MG tablet TAKE ONE TABLET BY MOUTH 30 MINUTES BEFORE MRI    . oxyCODONE (OXY IR/ROXICODONE) 5 MG immediate release tablet Take 1 tablet (5 mg total) by mouth every 4 (four) hours as needed (pain). (Patient not taking: Reported on 02/04/2020) 30 tablet 0  . SUMAtriptan (IMITREX) 100 MG tablet TAKE 1 TABLET BY MOUTH AT ONSET OF MIGRAINE MAY REPEAT 1 HOURS LATER IF NO IMPROVEMENT. DO NOT EXCEED 200MG  IN 24 HOURS    . tamsulosin (FLOMAX) 0.4 MG CAPS capsule Take 1 capsule (0.4 mg total) by mouth daily. (Patient not taking: Reported on 02/04/2020) 10 capsule 2  . topiramate (TOPAMAX) 25 MG tablet Take 25 mg by mouth daily.     No facility-administered medications prior to visit.    PAST MEDICAL HISTORY: Past Medical History:  Diagnosis Date  . Arthritis     PAST SURGICAL HISTORY: Past Surgical History:  Procedure Laterality Date  . APPENDECTOMY    . LAMINECTOMY N/A 08/19/2018   Procedure: Cervical four to Cervical six posterior cervical laminectomy for intradural tumor, cervical four  to cervical six instrumented fusion;  Surgeon: Judith Part, MD;  Location: [facility];  Service: Neurosurgery;  Laterality: N/A;  . NECK SURGERY  08/2018   Excision soft tissue tumor neck-per pt's notes from Dr. Izora Gala  . WISDOM TOOTH EXTRACTION      FAMILY HISTORY: History reviewed. No pertinent family history.  SOCIAL HISTORY: Social History   Socioeconomic History  . Marital status: Unknown    Spouse name: Not on file  . Number of children: Not on file  . Years of education: Not on file  . Highest education level: Not on file  Occupational History  . Not on file  Tobacco Use  .  Smoking status: Never Smoker  . Smokeless tobacco: Never Used  Substance and Sexual Activity  . Alcohol use: Not Currently  . Drug use: Never  . Sexual activity: Not on file  Other Topics Concern  . Not on file  Social History Narrative  . Not on file   Social Determinants of Health   Financial Resource Strain:   . Difficulty of Paying Living Expenses:   Food Insecurity:   . Worried About Charity fundraiser in the Last Year:   . Arboriculturist in the Last Year:   Transportation Needs:   . Film/video editor (Medical):   Marland Kitchen Lack of Transportation (Non-Medical):   Physical Activity:   . Days of Exercise per Week:   . Minutes of Exercise per Session:   Stress:   . Feeling of Stress :   Social Connections:   . Frequency of Communication with Friends and Family:   . Frequency of Social Gatherings with Friends and Family:   . Attends Religious Services:   . Active Member of Clubs or Organizations:   . Attends Archivist Meetings:   Marland Kitchen Marital Status:   Intimate Partner Violence:   . Fear of Current or Ex-Partner:   . Emotionally Abused:   Marland Kitchen Physically Abused:   . Sexually Abused:      PHYSICAL EXAM  GENERAL EXAM/CONSTITUTIONAL: Vitals:  Vitals:   02/04/20 1035  BP: 122/85  Pulse: 84  Temp: (!) 97 F (36.1 C)  Weight: 179 lb (81.2 kg)  Height: 5\' 3"  (1.6 m)     Body mass index is 31.71 kg/m. Wt Readings from Last 3 Encounters:  02/04/20 179 lb (81.2 kg)  09/15/18 172 lb 9.9 oz (78.3 kg)  08/19/18 173 lb 9.6 oz (78.7 kg)     Patient is in no distress; well developed, nourished and groomed; neck is supple  CARDIOVASCULAR:  Examination of carotid arteries is normal; no carotid bruits  Regular rate and rhythm, no murmurs  Examination of peripheral vascular system by observation and palpation is normal  EYES:  Ophthalmoscopic exam of optic discs and posterior segments is normal; no papilledema or hemorrhages  No exam data  present  MUSCULOSKELETAL:  Gait, strength, tone, movements noted in Neurologic exam below  NEUROLOGIC: MENTAL STATUS:  No flowsheet data found.  awake, alert, oriented to person, place and time  recent and remote memory intact  normal attention and concentration  language fluent, comprehension intact, naming intact  fund of knowledge appropriate  CRANIAL NERVE:   2nd - no papilledema on fundoscopic exam  2nd, 3rd, 4th, 6th - pupils equal and reactive to light, visual fields full to confrontation, extraocular muscles intact, no nystagmus  5th - facial sensation symmetric  7th - facial strength symmetric  8th - hearing intact  9th - palate elevates symmetrically, uvula midline  11th - shoulder shrug symmetric  12th - tongue protrusion midline  MOTOR:   normal bulk and tone, full strength in the BUE, BLE  SENSORY:   normal and symmetric to light touch, temperature, vibration  COORDINATION:   finger-nose-finger, fine finger movements normal  REFLEXES:   deep tendon reflexes present and symmetric  GAIT/STATION:   narrow based gait     DIAGNOSTIC DATA (LABS, IMAGING, TESTING) - I reviewed patient records, labs, notes, testing and imaging myself where available.  Lab Results  Component Value Date   WBC 7.7 12/23/2019   HGB 16.6 12/23/2019   HCT 47.7 12/23/2019   MCV 83.4 12/23/2019   PLT 265 12/23/2019      Component Value Date/Time   NA 143 12/23/2019 0440   K 3.6 12/23/2019 0440   CL 107 12/23/2019 0440   CO2 25 12/23/2019 0440   GLUCOSE 103 (H) 12/23/2019 0440   BUN 5 (L) 12/23/2019 0440   CREATININE 0.79 12/23/2019 0440   CALCIUM 9.3 12/23/2019 0440   PROT 7.5 12/23/2019 0440   ALBUMIN 4.6 12/23/2019 0440   AST 34 12/23/2019 0440   ALT 53 (H) 12/23/2019 0440   ALKPHOS 62 12/23/2019 0440   BILITOT 0.8 12/23/2019 0440   GFRNONAA >60 12/23/2019 0440   GFRAA >60 12/23/2019 0440   No results found for: CHOL, HDL, LDLCALC, LDLDIRECT,  TRIG, CHOLHDL No results found for: HGBA1C No results found for: VITAMINB12 No results found for: TSH   07/25/18 MRI cervical spine [I reviewed images myself and agree with interpretation. -VRP]  1. Avidly enhancing heterogeneous mass lesion extends through and distends the left neural foramen at C5-6. There is a significant intracanalicular component. 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 2. Significant mass effect on the cervical spinal cord due to the nerve sheath tumor. The cord is displaced to the right. No definite cord signal abnormality is present.  12/23/19 CT head [I reviewed images myself and agree with interpretation. -VRP]  1. No acute intracranial abnormality. 2. Patchy areas of subcortical and deep white matter hypoattenuation are noted in both cerebral hemispheres. This is a nonspecific finding in a patient of this age the differential is quite broad including sequela of migraine, demyelinating disease, atypical infection such as Lyme disease, as well as microvascular angiopathy changes.   ASSESSMENT AND PLAN  34 y.o. year old male here with migraine with aura since 2019.  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  Will check MRI of the brain to rule out any other secondary causes of headache.  We will also start additional migraine treatments.  Meds tried: topiramate, gabapentin, imitrex, tylenol, tramadol  Dx:  1. Migraine with aura and without status migrainosus, not intractable   2. 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 MR BRAIN W WO CONTRAST    Meds ordered this encounter  Medications  . amitriptyline (ELAVIL) 25 MG tablet    Sig: Take 1 tablet (25 mg total) by mouth at bedtime.    Dispense:  30 tablet    Refill:  12  . Erenumab-aooe (AIMOVIG) 70 MG/ML SOAJ    Sig: Inject 70 mg into the skin every 30 (thirty) days.    Dispense:  3 pen    Refill:  4  . rizatriptan (MAXALT-MLT) 10 MG disintegrating tablet    Sig: Take 1 tablet (10 mg total) by mouth as needed for migraine. May repeat in 2 hours if needed    Dispense:  9 tablet    Refill:  11  . Rimegepant Sulfate (NURTEC) 75 MG TBDP    Sig: Take 75 mg by mouth daily as needed.    Dispense:  8 tablet    Refill:  6   Return in about 6 months (around 08/05/2020) for with NP (Amy Lomax).    Penni Bombard, MD [id number], [id number] AM Certified in Neurology, Neurophysiology and Neuroimaging  [facility] [address], [place], [place] [address] [phone]

## 2020-02-07 ENCOUNTER — Encounter: Payer: Self-pay | Admitting: *Deleted

## 2020-02-07 ENCOUNTER — Telehealth: Payer: Self-pay | Admitting: *Deleted

## 2020-02-07 NOTE — Telephone Encounter (Signed)
Aimovig PA, key: [id number], [id number],  failed: topamax, gabapentin, Imitrex, tylenol, tramadol. Your information has been submitted and will be reviewed by [place]. You may close this dialog, return to your dashboard, and perform other tasks. An electronic determination will be received in CoverMyMeds within 72-120 hour.

## 2020-02-07 NOTE — Telephone Encounter (Addendum)
Aimovig Approved;  Start Date:02/07/2020; End Date:08/05/2020. Sent my chart to advise.

## 2020-03-08 ENCOUNTER — Other Ambulatory Visit: Payer: 59

## 2020-03-08 NOTE — Telephone Encounter (Signed)
Dennis Carrillo:[id number] (exp. 03/07/20 to 06/05/20)

## 2020-03-09 ENCOUNTER — Other Ambulatory Visit: Payer: 59

## 2020-05-22 IMAGING — MR MR CERVICAL SPINE WO/W CM
6 of 8 series · 30 of 48 positions shown · IV contrast (Eovist)
Comparison: Cervical spine radiographs 07/25/2018 and 05/22/2018

CLINICAL DATA: Neck pain, red flag. Neck pain for 3 months. Pain
worse with lateral movement.

EXAM:
MRI CERVICAL SPINE WITHOUT AND WITH CONTRAST
TECHNIQUE: Multiplanar and multiecho pulse sequences of the cervical spine, to
include the craniocervical junction and cervicothoracic junction,
were obtained without and with intravenous contrast.
CONTRAST:  7 mL Gadavist

[Series 5: T2 · sagittal · 3.0mm · 0.69mm/px · 3 of 15 slices shown (1 of 2)]
[im 1/15]
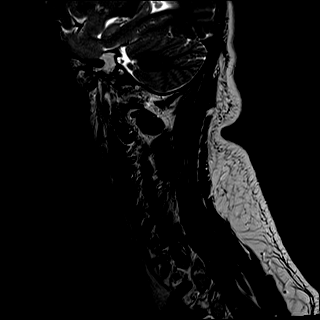
[im 8/15]
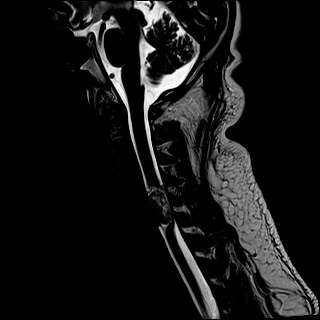
[im 15/15]
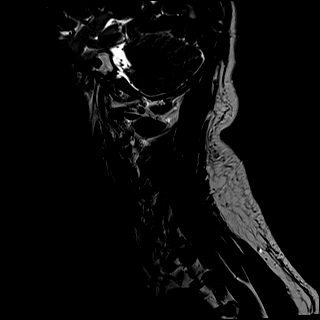

[Series 6: T1 · sagittal · 3.0mm · 0.69mm/px · 3 of 15 slices shown (1 of 2)]
[im 1/15]
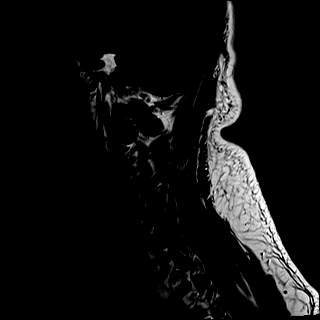
[im 8/15]
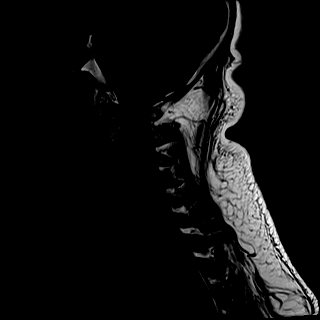
[im 15/15]
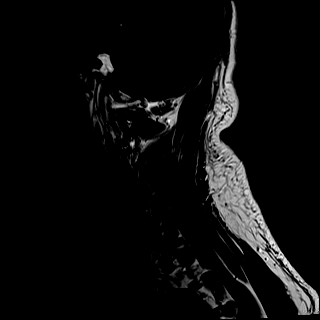

[Series 7: STIR · sagittal · 3.0mm · 0.86mm/px · 3 of 15 slices shown]
[im 1/15]
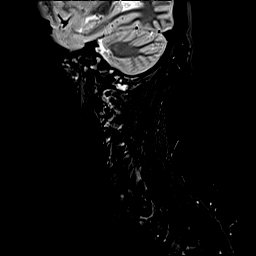
[im 8/15]
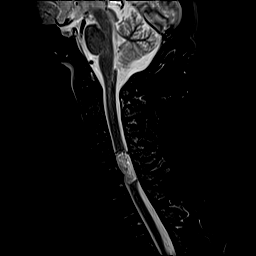
[im 15/15]
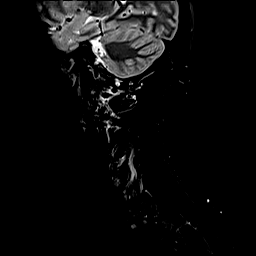

[Series 8: T2 · axial · 3.0mm · 0.66mm/px · z∈[-83,+59]mm · 9 of 46 slices shown (2 of 2)]
[im 1/46]
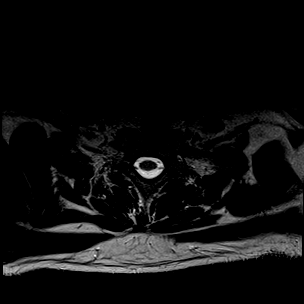
[im 6/46]
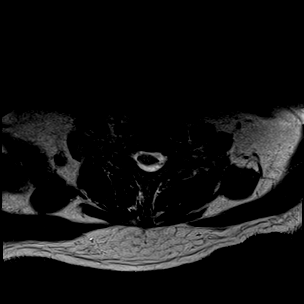
[im 12/46]
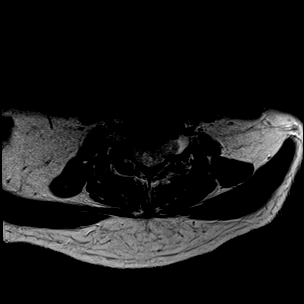
[im 17/46]
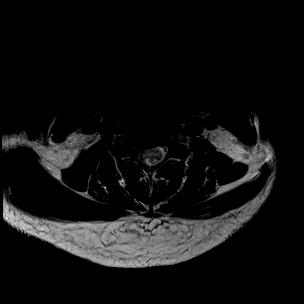
[im 23/46]
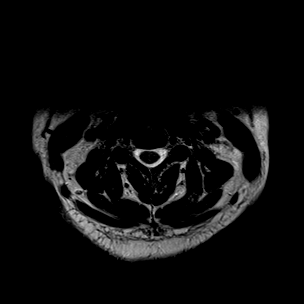
[im 29/46]
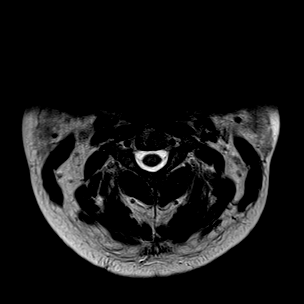
[im 34/46]
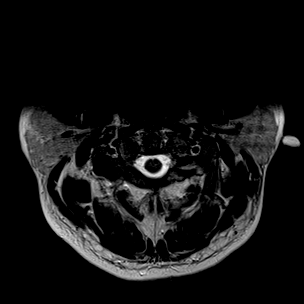
[im 40/46]
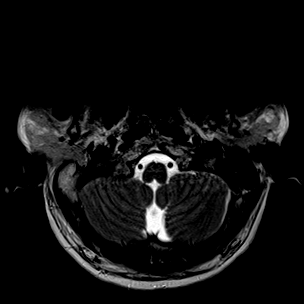
[im 46/46]
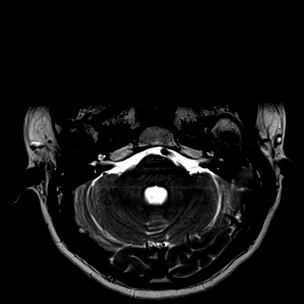

[Series 11: T1 · axial · 3.0mm · 0.39mm/px · z∈[-83,+59]mm · 9 of 46 slices shown (2 of 2)]
[im 1/46]
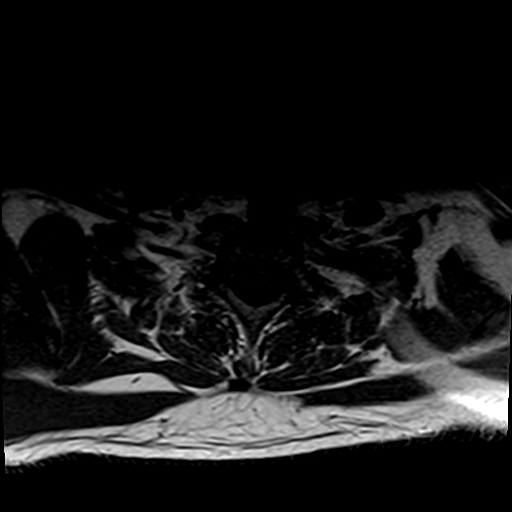
[im 6/46]
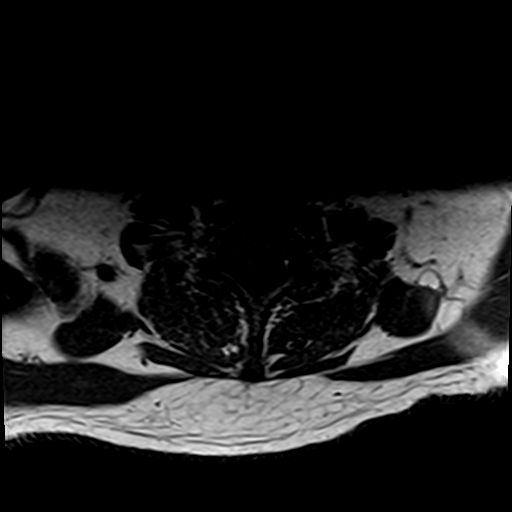
[im 12/46]
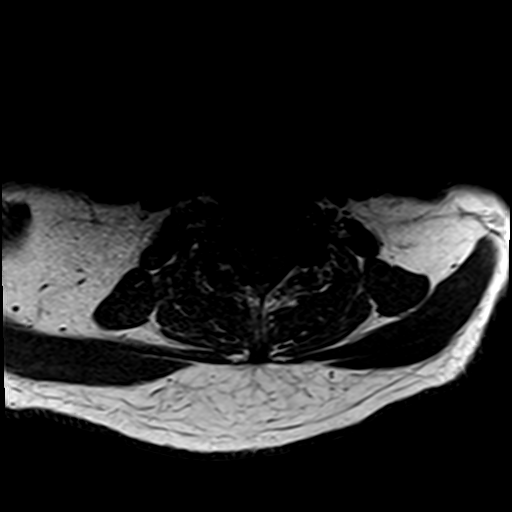
[im 17/46]
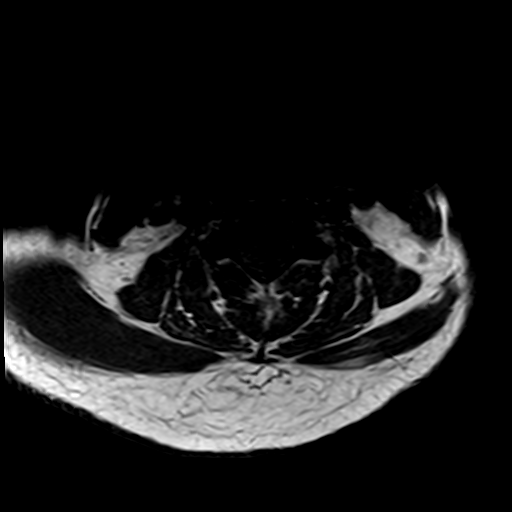
[im 23/46]
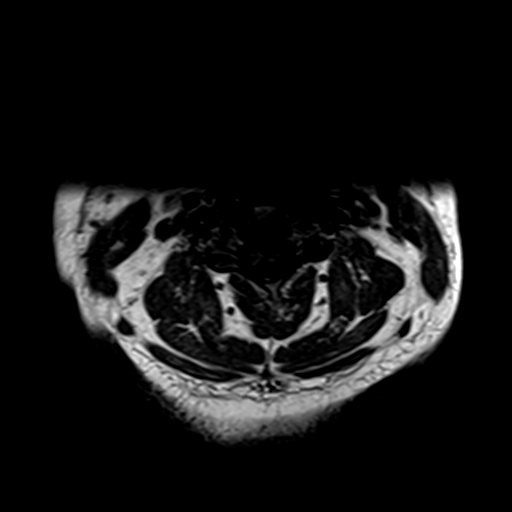
[im 29/46]
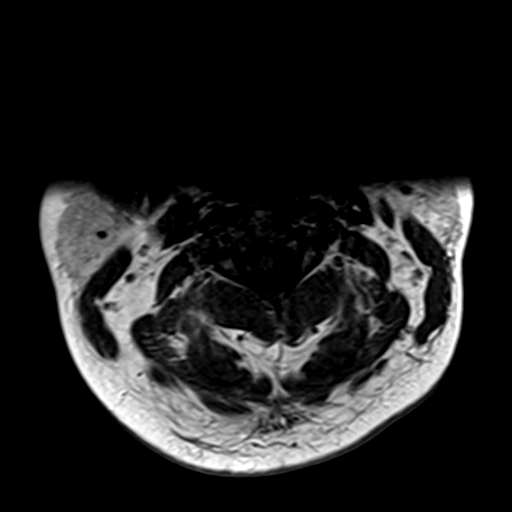
[im 34/46]
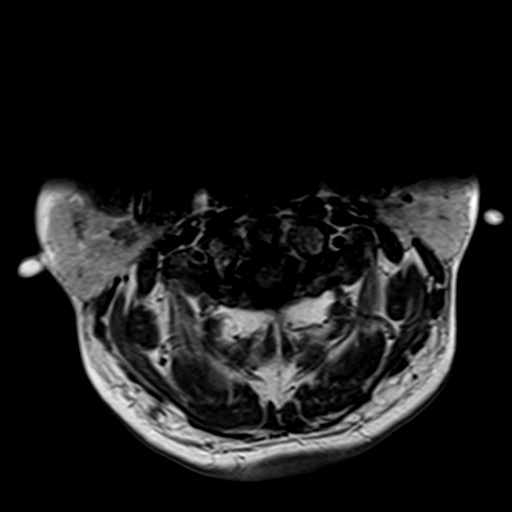
[im 40/46]
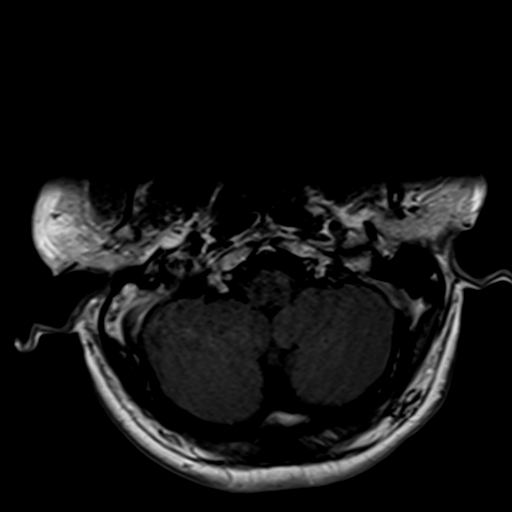
[im 46/46]
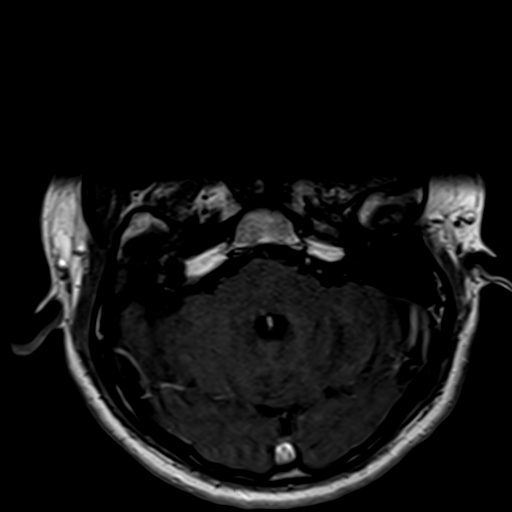

[Series 12: T1 fat-sat post-contrast · sagittal · 3.0mm · 0.43mm/px · 3 of 15 slices shown]
[im 1/15]
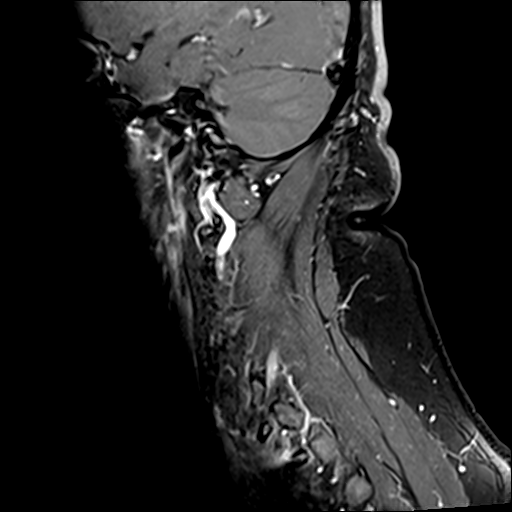
[im 8/15]
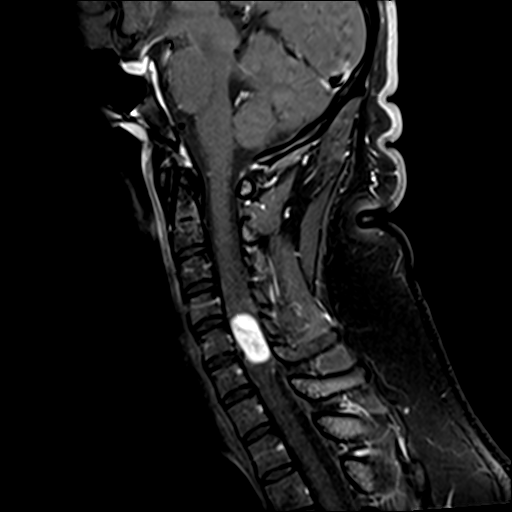
[im 15/15]
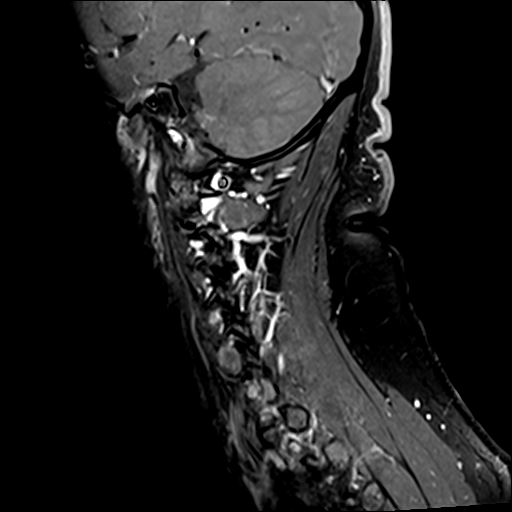

[30 of 48 positions shown; findings below may reference images not displayed]

FINDINGS: Alignment: AP alignment is anatomic.

Vertebrae: Marrow signal and vertebral body heights are normal.

Cord: The cervical spinal cord is markedly displaced to the right by
tumor mass on the left at C4-5 and C5-6. No abnormal cord signal is
present. No intramedullary mass lesion is present.

Posterior Fossa, vertebral arteries, paraspinal tissues:
Craniocervical junction is normal. Visualized intracranial contents
are normal.

Disc levels:

C2-3: Negative.

C3-4: Negative.

C4-5: No focal disc disease is present at the this level. The spinal
cord is being pushed to the right.

C5-6: A T2 heterogeneous lesion with diffuse enhancement is centered
at the left neural foramen of C5-6. The lesion measures 2.8 x 2.3 x
1.2 cm. There is widening of the left neural foramen. The spinal
cord is displaced to the right and compressed.

C6-7: The spinal cord is slightly displaced to the right due to mass
effect from above. There is no significant stenosis otherwise.

C7-T1: Negative.
IMPRESSION: 1. Avidly enhancing heterogeneous mass lesion extends through and
distends the left neural foramen at C5-6. There is a significant
intracanalicular component. This is most consistent with a nerve
sheath tumor, likely a schwannoma.
2. Significant mass effect on the cervical spinal cord due to the
nerve sheath tumor. The cord is displaced to the right. No definite
cord signal abnormality is present.

These results were called by telephone at the time of interpretation
on 07/25/2018 at [DATE] to GOSE PORTAL , who verbally
acknowledged these results.

## 2020-07-24 ENCOUNTER — Telehealth: Payer: Self-pay | Admitting: *Deleted

## 2020-07-24 NOTE — Telephone Encounter (Signed)
Aimovig PA, key: [id number], [id number], failed: topamax, imitrex, gabapentin, tylenol, tramdol. Your information has been submitted and will be reviewed by [place]. You may close this dialog, return to your dashboard, and perform other tasks. An electronic determination will be received in CoverMyMeds within 72-120 hours. You will receive a fax copy of the determination. If Christella Scheuermann has not responded in 120 hours, contact [employer] at [phone].

## 2020-07-31 ENCOUNTER — Encounter: Payer: Self-pay | Admitting: *Deleted

## 2020-07-31 NOTE — Telephone Encounter (Addendum)
Per CMM, AImovig approved , case ID# [id number]; Coverage Start Date:07/10/2020;Coverage End Date:07/31/2021. Sent my chart to advise patient.

## 2020-08-03 ENCOUNTER — Ambulatory Visit: Payer: 59 | Admitting: Family Medicine

## 2020-08-03 ENCOUNTER — Other Ambulatory Visit: Payer: Self-pay | Admitting: Orthopedic Surgery

## 2020-08-03 DIAGNOSIS — R2240 Localized swelling, mass and lump, unspecified lower limb: Secondary | ICD-10-CM

## 2020-08-24 ENCOUNTER — Ambulatory Visit
Admission: RE | Admit: 2020-08-24 | Discharge: 2020-08-24 | Disposition: A | Discharge status: Home / Self Care | Payer: 59 | Source: Ambulatory Visit | Attending: Orthopedic Surgery | Admitting: Orthopedic Surgery

## 2020-08-24 DIAGNOSIS — R2240 Localized swelling, mass and lump, unspecified lower limb: Secondary | ICD-10-CM

## 2020-10-26 ENCOUNTER — Encounter: Payer: Self-pay | Admitting: Physical Therapy

## 2020-10-26 NOTE — Therapy (Signed)
[facility] [address] [place], [place], [address] Phone: [phone]   Fax:  [phone]  Patient Details  Name: Dennis Carrillo MRN: [id number] Date of Birth: 1986-01-31 Referring Provider:  No ref. provider found  Encounter Date: 10/26/2020  PHYSICAL THERAPY DISCHARGE SUMMARY  Visits from Start of Care: 3  Current functional level related to goals / functional outcomes: Unable to formally assess; pt did not return for final session.   Remaining deficits: Dizziness   Education / Equipment: HEP  Plan: Patient agrees to discharge.  Patient goals were not met. Patient is being discharged due to not returning since the last visit.  ?????     Rico Junker, PT, DPT 10/26/20    2:33 PM    [facility] [address] [place], [place], [address] Phone: [phone]   Fax:  [phone]

## 2021-02-19 ENCOUNTER — Other Ambulatory Visit: Payer: Self-pay | Admitting: Physician Assistant

## 2021-02-19 ENCOUNTER — Ambulatory Visit
Admission: RE | Admit: 2021-02-19 | Discharge: 2021-02-19 | Disposition: A | Discharge status: Home / Self Care | Payer: 59 | Source: Ambulatory Visit | Attending: Physician Assistant | Admitting: Physician Assistant

## 2021-02-19 DIAGNOSIS — M542 Cervicalgia: Secondary | ICD-10-CM

## 2021-02-19 DIAGNOSIS — M546 Pain in thoracic spine: Secondary | ICD-10-CM

## 2021-03-14 ENCOUNTER — Telehealth: Payer: Self-pay | Admitting: Genetic Counselor

## 2021-03-14 NOTE — Telephone Encounter (Signed)
Scheduled appt per 5/25 referral. Pt aware.

## 2021-04-03 ENCOUNTER — Inpatient Hospital Stay: Payer: 59

## 2021-04-03 ENCOUNTER — Inpatient Hospital Stay: Payer: 59 | Attending: Genetic Counselor | Admitting: Genetic Counselor

## 2021-04-03 ENCOUNTER — Other Ambulatory Visit: Payer: Self-pay

## 2021-04-03 DIAGNOSIS — D492 Neoplasm of unspecified behavior of bone, soft tissue, and skin: Secondary | ICD-10-CM

## 2021-04-12 NOTE — Progress Notes (Signed)
REFERRING PROVIDER: Judith Part, MD [place],  [place] [address]  PRIMARY PROVIDER:  Salvadore Farber, FNP  PRIMARY REASON FOR VISIT:  1. Nerve sheath tumor       HISTORY OF PRESENT ILLNESS:   Mr. Wilkie, a 35 y.o. male, was seen for a [facility] cancer genetics consultation at the request of Dr. Zada Finders due to a personal history of multiple schwannomas. Mr. Nouri presents to clinic today to discuss the possibility of a hereditary predisposition to schwannomas and/or cancer, genetic testing, and to further clarify his future cancer risks, as well as potential risks for family members.  In November of 2019, at the age of 72, Mr. 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 Then, in November of 2021, he was found to have a suspected schwannoma of proximal tibial or common peroneal nerve identified on MRI of the left knee.   This appointment was completed with the assistance of a Spanish interpreter.  Past Medical History:  Diagnosis Date   Arthritis     Past Surgical History:  Procedure Laterality Date   APPENDECTOMY     LAMINECTOMY N/A 08/19/2018   Procedure: Cervical four to Cervical six posterior cervical laminectomy for intradural tumor, cervical four to cervical six instrumented fusion;  Surgeon: Judith Part, MD;  Location: [facility];  Service: Neurosurgery;  Laterality: N/A;   NECK SURGERY  08/2018   Excision soft tissue tumor neck-per pt's notes from Dr. Izora Gala   WISDOM TOOTH EXTRACTION      Social History   Socioeconomic History   Marital status: Married    Spouse name: Not on file   Number of children: Not on file   Years of education: Not on file   Highest education level: Not on file  Occupational History   Not on file  Tobacco Use   Smoking status: Never   Smokeless tobacco: Never  Vaping Use   Vaping Use: Never used  Substance and Sexual  Activity   Alcohol use: Not Currently   Drug use: Never   Sexual activity: Not on file  Other Topics Concern   Not on file  Social History Narrative   Not on file   Social Determinants of Health   Financial Resource Strain: Not on file  Food Insecurity: Not on file  Transportation Needs: Not on file  Physical Activity: Not on file  Stress: Not on file  Social Connections: Not on file     FAMILY HISTORY:  We obtained a detailed, 4-generation family history.  Significant diagnoses are listed below: Family History  Problem Relation Age of Onset   Heart attack Father 107   Hearing loss Maternal Grandfather        older age   Mr. Reede has two daughters (ages 15 and 36) and one son (age 36). He has one maternal half-brother (age 105) and two maternal half-sisters (ages 69 and 35). None of these relatives have had cancer or schwannomas.  Mr. Prust mother is alive at age 37 without cancer or schwannomas. There were five maternal aunts and five maternal uncles. There is no known cancer or schwannomas among maternal aunts/uncles or maternal cousins. Mr. Provencal maternal grandmother is alive at age 71 without cancer. His maternal grandfather is alive at age 83 without cancer. His maternal grandfather has hearing loss that began at an older age.  [name]'s father died at age 18 without cancer  or schwannomas. There were five paternal aunts and three paternal uncles. There is no known cancer among paternal aunts/uncles or paternal cousins, although he has limited information about this side of the family. Mr. Pomplun has limited information about his paternal grandparents.   Mr. Stillinger is unaware of previous family history of genetic testing for hereditary cancer risks. Patient's maternal ancestors are of [place] descent, and paternal ancestors are of [place] descent. There is no reported Ashkenazi Jewish ancestry. There is no known  consanguinity.  GENETIC COUNSELING ASSESSMENT: Mr. Wiebelhaus is a 35 y.o. male with a personal history of multiple schwannomas. We, therefore, discussed and recommended the following at today's visit.   DISCUSSION:  Neurofibromatosis type 2 (NF2) and schwannomatosis (SWN) are genetically distinct tumor predisposition syndromes with overlapping phenotypes. Individuals with NF2 usually present with bilateral vestibular schwannomas, and may also develop schwannomas on other cranial, spinal, and peripheral/cutaneous nerves, as well as meningiomas. NF2 may also present with ophthalmic features, including lenticular opacities, retinal hamartoma, epiretinal membranes. Clinical diagnostic criteria for SWN has classically been defined as the presence of two or more schwannomas (one proven histologically) with exclusion of vestibular schwannomas on MRI after age 23 years.   These diagnostic criteria classify patients primarily on the basis of clinical features; however, it is now apparent that the phenotype of these diseases spans a continuum without absolute delineation of subtypes phenotypically. New diagnostic criteria suggest the use of "schwannomatosis" as an umbrella term to describe the overlapping conditions in which an individual has many schwannomas (PMID: 63893734). These conditions can be further classified by the gene harboring a pathogenic variant on molecular analysis:  NF2-related schwannomatosis, SMARCB1-related schwannomatosis, LZTR1-related schwannomatosis, 22q-related schwannomatosis, schwannomatosis NOS (not otherwise specified; for patients who have not had genetic testing), and schwannomatosis NEC (not elsewhere classified; for patients in whom genetic testing failed to detect a pathogenic variant).  We reviewed that approximately 30-40% of individuals who meet clinical diagnostic criteria for schwannomatosis with no family history will have a pathogenic variant detected on genetic  testing. Identifying a hereditary predisposition to schwannomas and/or cancer is beneficial for several reasons, including knowing about other cancer/tumor risks, identifying potential screening and risk-reduction options that may be appropriate, and to understand if other family members could be at risk for schwannomas and/or cancer and allow them to undergo genetic testing.  We reviewed the characteristics, features and inheritance patterns of hereditary cancer syndromes. We also discussed genetic testing, including the appropriate family members to test, the process of testing, insurance coverage and turn-around-time for results. We discussed the implications of a negative, positive and/or variant of uncertain significant result. We recommended Mr. Seeman pursue genetic testing for the [facility] CancerNext-Expanded + RNAinsight gene panel.   The CancerNext-Expanded + RNAinsight gene panel offered by [employer] and includes sequencing and rearrangement analysis for the following 77 genes: AIP, ALK, APC, ATM, AXIN2, BAP1, BARD1, BLM, BMPR1A, BRCA1, BRCA2, BRIP1, CDC73, CDH1, CDK4, CDKN1B, CDKN2A, CHEK2, CTNNA1, DICER1, FANCC, FH, FLCN, GALNT12, KIF1B, LZTR1, MAX, MEN1, MET, MLH1, MSH2, MSH3, MSH6, MUTYH, NBN, NF1, NF2, NTHL1, PALB2, PHOX2B, PMS2, POT1, PRKAR1A, PTCH1, PTEN, RAD51C, RAD51D, RB1, RECQL, RET, SDHA, SDHAF2, SDHB, SDHC, SDHD, SMAD4, SMARCA4, SMARCB1, SMARCE1, STK11, SUFU, TMEM127, TP53, TSC1, TSC2, VHL and XRCC2 (sequencing and deletion/duplication); EGFR, EGLN1, HOXB13, KIT, MITF, PDGFRA, POLD1 and POLE (sequencing only); EPCAM and GREM1 (deletion/duplication only). RNA data is routinely analyzed for use in variant interpretation for all genes.  Given Mr. Amadon personal history of two schwannomas, one of which was  pathologically confirmed, he meets medical criteria for genetic testing. Despite that he meets criteria, there may still be an out of pocket cost.   PLAN: After  considering the risks, benefits, and limitations, Mr. Sagan provided informed consent to pursue genetic testing and the blood sample was sent to [facility] for analysis of the CancerNext-Expanded + RNAinsight panel. Results should be available within approximately two-three weeks' time, at which point they will be disclosed by telephone to Mr. Phillis, as will any additional recommendations warranted by these results. Mr. Bottomley will receive a summary of his genetic counseling visit and a copy of his results once available. This information will also be available in Epic.   Mr. Retherford questions were answered to his satisfaction today. Our contact information was provided should additional questions or concerns arise. Thank you for the referral and allowing [place] to share in the care of your patient.   Clint Guy, [place], [facility] Licensed, Certified Dispensing [email or web address] Phone: [phone]  The patient was seen for a total of 40 minutes in face-to-face genetic counseling. Patient was seen alone. This patient was discussed with Drs. Magrinat, Lindi Adie and/or Burr Medico who agrees with the above.    _______________________________________________________________________ For Office Staff:  Number of people involved in session: 1 Was an Intern/ student involved with case: no

## 2021-04-16 ENCOUNTER — Encounter: Payer: Self-pay | Admitting: Genetic Counselor

## 2021-04-30 DIAGNOSIS — Z1379 Encounter for other screening for genetic and chromosomal anomalies: Secondary | ICD-10-CM | POA: Insufficient documentation

## 2021-04-30 HISTORY — DX: Encounter for other screening for genetic and chromosomal anomalies: Z13.79

## 2021-05-03 ENCOUNTER — Telehealth: Payer: Self-pay | Admitting: Genetic Counselor

## 2021-05-03 ENCOUNTER — Ambulatory Visit: Payer: Self-pay | Admitting: Genetic Counselor

## 2021-05-03 DIAGNOSIS — Z1379 Encounter for other screening for genetic and chromosomal anomalies: Secondary | ICD-10-CM

## 2021-05-03 NOTE — Telephone Encounter (Signed)
Revealed negative genetic testing. Discussed that we do not know why he has had multiple schwannomas. It is possible that there could be a mutation in a different gene that we are not testing, or our current technology may not be able to detect certain mutations. It will therefore be important for him to stay in contact with genetics to keep up with whether additional testing may be appropriate in the future.   A variant of uncertain significance was detected in the TSC2 gene called [id number]. His result is still considered normal at this time and should not impact his medical management.

## 2021-05-11 NOTE — Progress Notes (Signed)
HPI:  [name] was previously seen in the [facility] clinic due to a personal history of multiple schwannomas and concerns regarding a hereditary predisposition to tumors. Please refer to our prior cancer genetics clinic note for more information regarding our discussion, assessment and recommendations, at the time. [name] recent genetic test results were disclosed to him, as were recommendations warranted by these results. These results and recommendations are discussed in more detail below.  FAMILY HISTORY:  We obtained a detailed, 4-generation family history.  Significant diagnoses are listed below: Family History  Problem Relation Age of Onset   Heart attack Father 32   Hearing loss Maternal Grandfather        older age    [name] has two daughters (ages 8 and 1) and one son (age 63). He has one maternal half-brother (age 29) and two maternal half-sisters (ages 53 and 58). None of these relatives have had cancer or schwannomas.   [name] mother is alive at age 31 without cancer or schwannomas. There were five maternal aunts and five maternal uncles. There is no known cancer or schwannomas among maternal aunts/uncles or maternal cousins. [name] maternal grandmother is alive at age 83 without cancer. His maternal grandfather is alive at age 19 without cancer. His maternal grandfather has hearing loss that began at an older age.   [name]'s father died at age 51 without cancer or schwannomas. There were five paternal aunts and three paternal uncles. There is no known cancer among paternal aunts/uncles or paternal cousins, although he has limited information about this side of the family. [name] has limited information about his paternal grandparents.    [name] is unaware of previous family history of genetic testing for hereditary cancer risks. Patient's maternal  ancestors are of [place] descent, and paternal ancestors are of [place] descent. There is no reported Ashkenazi Jewish ancestry. There is no known consanguinity.  GENETIC TEST RESULTS: Genetic testing reported out on 04/30/2021 through the [facility] + RNAinsight panel. No pathogenic variants were detected.   The CancerNext-Expanded + RNAinsight gene panel offered by [employer] and includes sequencing and rearrangement analysis for the following 77 genes: AIP, ALK, APC, ATM, AXIN2, BAP1, BARD1, BLM, BMPR1A, BRCA1, BRCA2, BRIP1, CDC73, CDH1, CDK4, CDKN1B, CDKN2A, CHEK2, CTNNA1, DICER1, FANCC, FH, FLCN, GALNT12, KIF1B, LZTR1, MAX, MEN1, MET, MLH1, MSH2, MSH3, MSH6, MUTYH, NBN, NF1, NF2, NTHL1, PALB2, PHOX2B, PMS2, POT1, PRKAR1A, PTCH1, PTEN, RAD51C, RAD51D, RB1, RECQL, RET, SDHA, SDHAF2, SDHB, SDHC, SDHD, SMAD4, SMARCA4, SMARCB1, SMARCE1, STK11, SUFU, TMEM127, TP53, TSC1, TSC2, VHL and XRCC2 (sequencing and deletion/duplication); EGFR, EGLN1, HOXB13, KIT, MITF, PDGFRA, POLD1 and POLE (sequencing only); EPCAM and GREM1 (deletion/duplication only). RNA data is routinely analyzed for use in variant interpretation for all genes. The test report will be scanned into EPIC and located under the Molecular Pathology section of the Results Review tab.  A portion of the result report is included below for reference.     Genetic testing did identify a variant of uncertain significance (VUS) in the TSC2 gene called  [id number] ([id number]). At this time, it is unknown if this variant is associated with increased cancer risk or if this is a normal finding, but most variants such as this get reclassified to being inconsequential. It should not be used to make medical management decisions. With time, we suspect the lab will determine the significance of this variant, if any. If we do learn more about it, we will try to  contact [name] to discuss it further. However, it is important to  stay in touch with [place] periodically and keep the address and phone number up to date.   DISCUSSION OF RESULTS & SCHWANNOMATOSIS: We discussed with Mr. Dettmer that because current genetic testing is not perfect, it is possible there may be a gene mutation in one of these genes that current testing cannot detect, but that chance is small. We also discussed that there could be another gene that has not yet been discovered, or that we have not yet tested, that is responsible for his history of multiple schwannomas. Therefore, it is important to remain in touch with cancer genetics in the future so that we can continue to offer [name] the most up to date genetic testing. Additionally, it is recommended he continue to follow the management and screening guidelines provided by his healthcare providers.    As discussed previously, new diagnostic criteria suggest the use of "schwannomatosis" as an umbrella term to describe the overlapping conditions in which an individual has many schwannomas (PMID: [id number]). These conditions can be further classified by the gene harboring a pathogenic variant on molecular analysis:  NF2-related schwannomatosis, SMARCB1-related schwannomatosis, LZTR1-related schwannomatosis, 22q-related schwannomatosis, schwannomatosis-NOS (not otherwise specified; for patients who have not had genetic testing), and schwannomatosis-NEC (not elsewhere classified; for patients in whom genetic testing failed to detect a pathogenic variant).  If genetic testing does not reveal a pathogenic variant in known schwannomatosis-related genes, as is the case for [name], a diagnosis of schwannomatosis-NEC can be made if both of the following criteria are met: Presence of two or more lesions on appropriate imaging consistent with non-intradermal schwannomas  At least one schwannoma or hybrid nerve sheath tumor is pathologically confirmed   Additionally, mosaic NF2 or mosaic  schwannomatosis, in which a pathogenic variant might not be detected in the blood but a shared pathogenic variant is present in two or more anatomically unrelated tumors, cannot be ruled out without additional somatic testing of two tumors.    ADDITIONAL GENETIC TESTING: We discussed with Mr. Brallier that his genetic testing was fairly extensive. If there are genes identified to increase tumor risk that can be analyzed in the future, we would be happy to discuss and coordinate this testing at that time.    For individuals with schwannomatosis with no identified germline mutation, somatic testing of two schwannomas through a laboratory such as [facility] can be beneficial to further characterize the genetic etiology of the tumors.   RECOMMENDATIONS FOR FAMILY MEMBERS:  Individuals in this family might be at some increased risk of developing schwannomas, over the general population risk, simply due to the family history of schwannomas. All family members should notify their physicians about the family history of schwannomatosis. We also recommended women in this family have a yearly mammogram beginning at age 19, or 14 years younger than the earliest onset of cancer, an annual clinical breast exam, and perform monthly breast self-exams. Women in this family should also have a gynecological exam as recommended by their primary provider. All family members should be referred for colonoscopy starting at age 44.   FOLLOW-UP: Lastly, we discussed with Mr. Gonyea that cancer genetics is a rapidly advancing field and it is possible that new genetic tests will be appropriate for him and/or his family members in the future. We encouraged him to remain in contact with cancer genetics on an annual basis so we can update his personal and family histories  and let him know of advances in cancer genetics that may benefit this family.   Our contact number was provided. Mr.  Imbert questions were answered to his satisfaction, and he knows he is welcome to call us at anytime with additional questions or concerns.   Clint Guy, MS, [facility] Genetic Counselor [email or web address] Phone: [phone]

## 2021-05-16 ENCOUNTER — Encounter: Payer: Self-pay | Admitting: Genetic Counselor

## 2021-07-18 ENCOUNTER — Encounter: Payer: Self-pay | Admitting: *Deleted

## 2021-07-18 ENCOUNTER — Telehealth: Payer: Self-pay | Admitting: *Deleted

## 2021-07-18 NOTE — Telephone Encounter (Signed)
[employer] PA, [id number]. Received message that patient's insurance is inactive. [place], closed until 2 pm.

## 2021-07-18 NOTE — Telephone Encounter (Signed)
Called pharmacy, spoke with Clent Ridges and she gave this insurance : Unknown Jim [id number] Group [id number] Received same message from [facility]. Called  [employer] help desk, spoke with Arbie Cookey who stated he has a Rx benefits plan, [phone], transferred the call, it was cut off. Called back,  spoke with Elta Guadeloupe with [facility] PA dept. He stated the patient is not a client of theirs, so they don't process PAs for him. He suggested I call pharmacy back.   [facility], spoke with Chider[place]ho stated the Rx has expired. He last picked up [employer] 02/09/2020. I advised her we will not send in new Rx until he is seen again.  Sent him my chart to advise he needs FU.

## 2024-04-13 ENCOUNTER — Other Ambulatory Visit: Payer: Self-pay

## 2024-04-13 DIAGNOSIS — R0789 Other chest pain: Secondary | ICD-10-CM | POA: Insufficient documentation

## 2024-04-13 DIAGNOSIS — M1990 Unspecified osteoarthritis, unspecified site: Secondary | ICD-10-CM | POA: Insufficient documentation

## 2024-04-14 ENCOUNTER — Encounter: Payer: Self-pay | Admitting: Cardiology

## 2024-04-14 ENCOUNTER — Ambulatory Visit: Attending: Cardiology | Admitting: Cardiology

## 2024-04-14 VITALS — BP 112/82 | HR 61 | Ht 63.0 in | Wt 192.0 lb

## 2024-04-14 DIAGNOSIS — R072 Precordial pain: Secondary | ICD-10-CM | POA: Diagnosis not present

## 2024-04-14 DIAGNOSIS — R0789 Other chest pain: Secondary | ICD-10-CM

## 2024-04-14 DIAGNOSIS — R0609 Other forms of dyspnea: Secondary | ICD-10-CM

## 2024-04-14 HISTORY — DX: Other forms of dyspnea: R06.09

## 2024-04-14 NOTE — Progress Notes (Signed)
 Cardiology Consultation:    Date:  04/14/2024   ID:  [name], DOB 13-Oct-1985, MRN [id number]  PCP:  Kathryne Marny GRADE, FNP  Cardiologist:  Lamar Fitch, MD   Referring MD: Loria Lin, FNP   Chief Complaint  Patient presents with   Chest Pain    History of Present Illness:    Dennis Carrillo is a 38 y.o. male who is being seen today for the evaluation of chest pain at the request of Loria Lin, FNP.  Past medical history significant for cervical spine 20 more, he was referred to us  because of chest pain.  He said for about 2 years he has been experiencing chest pain he describes as heaviness with sometimes burning that usually happen at rest last for about few minutes associated with some shortness of breath and sweating sometimes the sensation happen he exercise.  At the same time he is at different time he can play soccer and have no difficulty doing it.  He does not exercise on the regular basis.  He is originally from [place], however he does not have any family history of premature coronary disease.  He does not smoke never did he drink alcohol only once a year and New Year's Eve some from pain otherwise he is doing fine.  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 The report date is 04/30/2021.     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 Ida Loader   WISDOM TOOTH EXTRACTION      Current Medications: Current Meds  Medication Sig   aspirin EC 81 MG tablet Take 81 mg by mouth daily. Swallow whole.   Cholecalciferol (VITAMIN D3) 1.25 MG (50000 UT) CAPS Take 1 capsule by mouth once a week.     Allergies:   Patient has no known allergies.   Social History   Socioeconomic History   Marital status: Married    Spouse name: Not on file   Number of children: Not on file   Years of education: Not on file   Highest education level: Not on file  Occupational History   Not on file  Tobacco Use   Smoking status: Never   Smokeless tobacco: Never  Vaping Use   Vaping status: Never Used  Substance and Sexual Activity   Alcohol use: Not Currently   Drug use: Never   Sexual activity: Not on file  Other Topics Concern   Not on file  Social History Narrative   Not on file   Social Drivers of Health   Financial Resource Strain: Not on file  Food Insecurity: Not  on file  Transportation Needs: Not on file  Physical Activity: Not on file  Stress: Not on file  Social Connections: Not on file     Family History: The patient's family history includes Hearing loss in his maternal grandfather; Heart attack (age of onset: 37) in his father. ROS:   Please see the history of present illness.    All 14 point review of systems negative except as described per history of present illness.  EKGs/Labs/Other Studies Reviewed:    The following studies were reviewed today:   EKG:  EKG Interpretation Date/Time:  Wednesday April 14 2024 10:35:04 EDT Ventricular Rate:  61 PR Interval:  176 QRS Duration:  86 QT Interval:  378 QTC  Calculation: 380 R Axis:   6  Text Interpretation: Normal sinus rhythm Normal ECG When compared with ECG of 24-Aug-2018 18:28, Vent. rate has decreased BY  71 BPM Questionable change in QRS axis Confirmed by Bernie Charleston [phone]) on 04/14/2024 10:41:57 AM    Recent Labs: No results found for requested labs within last 365 days.  Recent Lipid Panel No results found for: CHOL, TRIG, HDL, CHOLHDL, VLDL, LDLCALC, LDLDIRECT  Physical Exam:    VS:  BP 112/82   Pulse 61   Ht 5' 3 (1.6 m)   Wt 192 lb (87.1 kg)   SpO2 97%   BMI 34.01 kg/m     Wt Readings from Last 3 Encounters:  04/14/24 192 lb (87.1 kg)  02/04/20 179 lb (81.2 kg)  09/15/18 172 lb 9.9 oz (78.3 kg)     GEN:  Well nourished, well developed in no acute distress HEENT: Normal NECK: No JVD; No carotid bruits LYMPHATICS: No lymphadenopathy CARDIAC: RRR, no murmurs, no rubs, no gallops RESPIRATORY:  Clear to auscultation without rales, wheezing or rhonchi  ABDOMEN: Soft, non-tender, non-distended MUSCULOSKELETAL:  No edema; No deformity  SKIN: Warm and dry NEUROLOGIC:  Alert and oriented x 3 PSYCHIATRIC:  Normal affect   ASSESSMENT:    1. Atypical chest pain   2. Dyspnea on exertion    PLAN:    In order of problems listed above:  Chest pain with some atypical characteristic but somewhat worrisome I had a long discussion with him about what to do with the situation I think the best approach will be to pursue coronary CT angio to have understanding if he got any obstructive lesion as well asked to see if he had any coronary artery disease.  I explained procedure to him he is willing to proceed.  In the meantime he is taking aspirin which I will continue. Dyspnea exertion we will try to get echocardiogram later for now we will get coronary CT angio. Cholesterol status unknown.  Will schedule him to have   Medication Adjustments/Labs and Tests Ordered: Current medicines are reviewed at length with  the patient today.  Concerns regarding medicines are outlined above.  Orders Placed This Encounter  Procedures   EKG 12-Lead   No orders of the defined types were placed in this encounter.   Signed, [place] DOROTHA Bernie, MD, [facility]. 04/14/2024 10:56 AM    [facility]

## 2024-04-14 NOTE — Patient Instructions (Addendum)
 Instrucciones de medicamentos: Su mdico recomienda que contine con sus medicamentos actuales segn las indicaciones. Consulte la lista de medicamentos actuales que se le entreg hoy. *Si necesita un resurtido de sus medicamentos cardacos antes de su prxima cita, llame a su farmacia*   Trabajo de laboratorio: Ninguno ordenado Si se le realizaron anlisis de laboratorio (anlisis de [place]) hoy y sus pruebas son completamente normales, recibir sus resultados solo cuando:  Mensaje de MyChart (si tiene MyChart) MALVA Query copia en papel por correo Si tiene alguna prueba de laboratorio que es anormal o necesitamos cambiar su tratamiento, lo llamaremos para revisar los [place].   Pruebas/Procedimientos:   Su CT cardaca se programar en uno de los lugares de abajo:   [facility] [address] [place], [place] [address]  Si althea query cita en la [facility] de [employer], ingrese al estacionamiento por la entrada de [address] y utilice el servicio de Scientist, water quality parking gratuito en la zona de descenso de [place]. Ingrese al edificio y regstrese en la planta principal.  Siga atentamente estas instrucciones (a menos que se indique lo contrario):  Se requerir una va intravenosa para esta prueba y se administrar nitroglicerina. Suspenda todos los medicamentos para la disfuncin erctil al menos 3 das (72 horas) antes de la prueba (es decir, viagra, cialis, sildenafil, tadalafil, etc.).   La noche antes de la prueba: Asegrese de beber [place] [employer]. No consuma ninguna bebida con cafena/descafeinada o chocolate 12 horas antes de su prueba. No consuma ningn antihistamnico 12 horas antes de su prueba.  El da de la prueba: Beba mucha agua hasta 1 hora antes de la prueba. No coma ningn alimento 1 hora antes de la prueba. Puede tomar sus medicamentos habituales antes de la prueba.   Despus de la prueba: [place] agua. Despus de recibir un  contraste IV, puede tener una sensacin leve de enrojecimiento. Esto es normal. En ocasiones, puede tener una erupcin leve hasta 24 horas despus de la prueba. Esto no es peligroso. Si esto sucede, puede tomar Benadryl  de 25 mg, Zyrtec, Claritin o Allegra y aumentar su consumo de lquidos. (Los pacientes que toman Tikosyn deben Automotive engineer Benadryl  y pueden tomar Zyrtec, Claritin o Allegra.) Si tiene dificultades para Industrial/product designer, esto puede ser serio. Si es grave, llame al 911 INMEDIATAMENTE. Si es leve, llame a nuestro consultorio.  Lo llamaremos para programar su prueba entre 2 y 4 semanas antes, entendiendo que algunas compaas de seguro necesitarn una autorizacin antes del servicio.   Para preguntas no relacionadas con la programacin, comunquese con el enfermero facilitador de imgenes cardacas si tiene alguna pregunta o preocupacin: Enfermero facilitador de imgenes cardacas Servicios cardacos y vasculares de [place] directo del consultorio: [phone]   Para programacin, incluyendo cancelaciones y reprogramaciones, llame a [place] al [phone].    Hacer un seguimiento: En [facility], usted y sus necesidades de salud son landry prioridad. Como parte de nuestra misin continua de brindarle una atencin cardaca excepcional, hemos creado equipos de atencin de proveedores designados. Estos equipos de atencin incluyen a su cardilogo primario (mdico) y proveedores de prctica avanzada (APP, asistentes mdicos y enfermeras practicantes) que trabajan juntos para brindarle la atencin que necesita, cuando la necesita.  Recomendamos registrarse en el portal del Automotive engineer. La informacin de registro se proporciona en este Resumen posterior a la visita. MyChart se usa  para conectarse con pacientes para visitas virtuales (telemedicina). Los [employer] de laboratorio/pruebas, notas de encuentros, prximas citas,  etc. Tambin se pueden enviar  mensajes no urgentes a su proveedor. Para obtener ms informacin sobre lo que [employer] con MyChart, vaya a [email or web address].  Su prxima cita: 12 meses)  El formato para su prxima cita: En persona  Proveedor: Dr. Lamar Fitch  Medication Instructions:  Your physician recommends that you continue on your current medications as directed. Please refer to the Current Medication list given to you today.  *If you need a refill on your cardiac medications before your next appointment, please call your pharmacy*   Lab Work: None ordered If you have labs (blood work) drawn today and your tests are completely normal, you will receive your results only by: MyChart Message (if you have MyChart) OR A paper copy in the mail If you have any lab test that is abnormal or we need to change your treatment, we will call you to review the results.   Testing/Procedures:   Your cardiac CT will be scheduled at one of the below locations:   [facility] [address]  [place], [place] [address]   If scheduled at the [facility] at [address], please enter the parking lot using the [address] entrance and use the FREE valet service at the patient drop-off area. Enter the buidling and check-in with registration on the main floor.  Please follow these instructions carefully (unless otherwise directed):  An IV will be required for this test and Nitroglycerin will be given.  Hold all erectile dysfunction medications at least 3 days (72 hrs) prior to test. (Ie viagra, cialis, sildenafil, tadalafil, etc)   On the Night Before the Test: Be sure to Drink plenty of water. Do not consume any caffeinated/decaffeinated beverages or chocolate 12 hours prior to your test. Do not take any antihistamines 12 hours prior to your test.  On the Day of the Test: Drink plenty of water until 1 hour prior to the test. Do not eat any food 1 hour prior  to test. You may take your regular medications prior to the test.       After the Test: Drink plenty of water. After receiving IV contrast, you may experience a mild flushed feeling. This is normal. On occasion, you may experience a mild rash up to 24 hours after the test. This is not dangerous. If this occurs, you can take Benadryl  25 mg, Zyrtec, Claritin, or Allegra and increase your fluid intake. (Patients taking Tikosyn should avoid Benadryl , and may take Zyrtec, Claritin, or Allegra) If you experience trouble breathing, this can be serious. If it is severe call 911 IMMEDIATELY. If it is mild, please call our office.  We will call to schedule your test 2-4 weeks out understanding that some insurance companies will need an authorization prior to the service being performed.   For more information and frequently asked questions, please visit our website : [email or web address]  For non-scheduling related questions, please contact the cardiac imaging nurse navigator should you have any questions/concerns: Cardiac Imaging Nurse Navigators Direct Office Dial: [phone]   For scheduling needs, including cancellations and rescheduling, please call [place], [phone].    Follow-Up: At [facility], you and your health needs are our priority.  As part of our continuing mission to provide you with exceptional heart care, we have created designated Provider Care Teams.  These Care Teams include your primary Cardiologist (physician) and Advanced Practice Providers (APPs -  Physician Assistants and Nurse Practitioners) who all work together to provide you  with the care you need, when you need it.  We recommend signing up for the patient portal called MyChart.  Sign up information is provided on this After Visit Summary.  MyChart is used to connect with patients for Virtual Visits (Telemedicine).  Patients are able to view lab/test results, encounter notes, upcoming appointments,  etc.  Non-urgent messages can be sent to your provider as well.   To learn more about what you can do with MyChart, go to [email or web address].    Your next appointment:   2 month(s)  The format for your next appointment:   In Person  Provider:   Lamar Fitch, MD   Other Instructions NA

## 2024-04-14 NOTE — Addendum Note (Signed)
 Addended by: ONEITA BERLINER on: 04/14/2024 11:12 AM   Modules accepted: Orders

## 2024-04-21 ENCOUNTER — Telehealth (HOSPITAL_COMMUNITY): Payer: Self-pay | Admitting: *Deleted

## 2024-04-21 NOTE — Telephone Encounter (Signed)
 Reaching out to patient to offer assistance regarding upcoming cardiac imaging study (via interpreter ID# [phone]); pt verbalizes understanding of appt date/time, parking situation and where to check in, pre-test NPO status and medications ordered, and verified current allergies; name and call back number provided for further questions should they arise Sid Seats RN Navigator Cardiac Imaging [facility] [phone] office [phone] cell

## 2024-04-22 ENCOUNTER — Ambulatory Visit (HOSPITAL_COMMUNITY)
Admission: RE | Admit: 2024-04-22 | Discharge: 2024-04-22 | Disposition: A | Discharge status: Home / Self Care | Source: Ambulatory Visit | Attending: Cardiology | Admitting: Cardiology

## 2024-04-22 DIAGNOSIS — I2510 Atherosclerotic heart disease of native coronary artery without angina pectoris: Secondary | ICD-10-CM | POA: Diagnosis not present

## 2024-04-22 DIAGNOSIS — R0789 Other chest pain: Secondary | ICD-10-CM

## 2024-04-22 DIAGNOSIS — R072 Precordial pain: Secondary | ICD-10-CM | POA: Diagnosis not present

## 2024-04-22 DIAGNOSIS — R0609 Other forms of dyspnea: Secondary | ICD-10-CM | POA: Insufficient documentation

## 2024-04-22 MED ORDER — NITROGLYCERIN 0.4 MG SL SUBL: 0.8000 mg | SUBLINGUAL_TABLET | Freq: Once | SUBLINGUAL | Status: DC

## 2024-04-22 MED ORDER — IOHEXOL 350 MG/ML SOLN
100.0000 mL | Freq: Once | INTRAVENOUS | Status: AC | PRN
Start: 2024-04-22 — End: 2024-04-22
  Administered 2024-04-22: 100 mL via INTRAVENOUS

## 2024-04-23 ENCOUNTER — Ambulatory Visit: Payer: Self-pay | Admitting: Cardiology

## 2024-04-23 ENCOUNTER — Telehealth: Payer: Self-pay

## 2024-04-23 NOTE — Telephone Encounter (Signed)
 Left message on My Chart with CT Angio results per Dr. Tonja Fray note. Routed to PCP.

## 2024-04-29 ENCOUNTER — Telehealth: Payer: Self-pay

## 2024-04-29 NOTE — Telephone Encounter (Signed)
 Left message on My Chart with CT angio results per Dr. Tonja Fray note. Routed to PCP.

## 2024-05-09 LAB — COMPREHENSIVE METABOLIC PANEL WITH GFR
ALT: 44 IU/L (ref 0–44)
AST: 26 IU/L (ref 0–40)
Albumin: 4.5 g/dL (ref 4.1–5.1)
Alkaline Phosphatase: 84 IU/L (ref 44–121)
BUN/Creatinine Ratio: 10 (ref 9–20)
BUN: 8 mg/dL (ref 6–20)
Bilirubin Total: 0.6 mg/dL (ref 0.0–1.2)
CO2: 22 mmol/L (ref 20–29)
Calcium: 9.3 mg/dL (ref 8.7–10.2)
Chloride: 103 mmol/L (ref 96–106)
Creatinine, Ser: 0.84 mg/dL (ref 0.76–1.27)
Globulin, Total: 2.5 g/dL (ref 1.5–4.5)
Glucose: 86 mg/dL (ref 70–99)
Potassium: 4.3 mmol/L (ref 3.5–5.2)
Sodium: 141 mmol/L (ref 134–144)
Total Protein: 7 g/dL (ref 6.0–8.5)
eGFR: 114 mL/min/1.73 (ref >59)

## 2024-05-09 LAB — LIPID PANEL
Chol/HDL Ratio: 4.7 ratio (ref 0.0–5.0)
Cholesterol, Total: 166 mg/dL (ref 100–199)
HDL: 35 mg/dL — ABNORMAL LOW (ref >39)
LDL Chol Calc (NIH): 109 mg/dL — ABNORMAL HIGH (ref 0–99)
Triglycerides: 122 mg/dL (ref 0–149)
VLDL Cholesterol Cal: 22 mg/dL (ref 5–40)

## 2024-05-09 LAB — LDL CHOLESTEROL, DIRECT: LDL Direct: 118 mg/dL — ABNORMAL HIGH (ref 0–99)

## 2024-05-09 LAB — LIPOPROTEIN A (LPA): Lipoprotein (a): 19.8 nmol/L (ref <75.0)

## 2024-05-30 ENCOUNTER — Other Ambulatory Visit: Payer: Self-pay

## 2024-06-22 ENCOUNTER — Ambulatory Visit: Attending: Cardiology | Admitting: Cardiology

## 2024-06-22 ENCOUNTER — Encounter: Payer: Self-pay | Admitting: Cardiology

## 2024-06-22 VITALS — BP 118/86 | HR 78 | Ht 64.0 in | Wt 192.8 lb

## 2024-06-22 DIAGNOSIS — R918 Other nonspecific abnormal finding of lung field: Secondary | ICD-10-CM | POA: Diagnosis not present

## 2024-06-22 DIAGNOSIS — R0789 Other chest pain: Secondary | ICD-10-CM | POA: Diagnosis not present

## 2024-06-22 DIAGNOSIS — E785 Hyperlipidemia, unspecified: Secondary | ICD-10-CM | POA: Insufficient documentation

## 2024-06-22 DIAGNOSIS — R0609 Other forms of dyspnea: Secondary | ICD-10-CM

## 2024-06-22 NOTE — Patient Instructions (Addendum)
 Medication Instructions:  Your physician recommends that you continue on your current medications as directed. Please refer to the Current Medication list given to you today.  *If you need a refill on your cardiac medications before your next appointment, please call your pharmacy*   Lab Work: None Ordered If you have labs (blood work) drawn today and your tests are completely normal, you will receive your results only by: MyChart Message (if you have MyChart) OR A paper copy in the mail If you have any lab test that is abnormal or we need to change your treatment, we will call you to review the results.   Testing/Procedures: None Ordered   Follow-Up: At [facility], you and your health needs are our priority.  As part of our continuing mission to provide you with exceptional heart care, we have created designated Provider Care Teams.  These Care Teams include your primary Cardiologist (physician) and Advanced Practice Providers (APPs -  Physician Assistants and Nurse Practitioners) who all work together to provide you with the care you need, when you need it.  We recommend signing up for the patient portal called MyChart.  Sign up information is provided on this After Visit Summary.  MyChart is used to connect with patients for Virtual Visits (Telemedicine).  Patients are able to view lab/test results, encounter notes, upcoming appointments, etc.  Non-urgent messages can be sent to your provider as well.   To learn more about what you can do with MyChart, go to [email or web address].    Your next appointment:   12 month(s)  The format for your next appointment:   In Person  Provider:   Lamar Fitch, MD    Other Instructions Referral to Pulmonologist - they will call for appt

## 2024-06-22 NOTE — Progress Notes (Unsigned)
 Cardiology Office Note:    Date:  06/22/2024   ID:  Dennis Carrillo, DOB 1986/05/26, MRN [id number]  PCP:  Kathryne Marny GRADE, FNP  Cardiologist:  Lamar Fitch, MD    Referring MD: Kathryne Marny GRADE, FNP   No chief complaint on file.   History of Present Illness:    Dennis Carrillo is a 38 y.o. male past medical history significant for cervical spine tumor more than 20 years ago, dyslipidemia.  He was referred to us  because of atypical chest pain.  Pain happen in different situation when he is laying down or sitting down at the same time he plays soccer with no difficulties.  We end up doing coronary CT angio, coronary CT angio show calcium score 0, no obstructive disease plaque analysis showed very low volume of plaque he comes today to discuss this.  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 The report date is 04/30/2021.     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 Ida Loader   WISDOM TOOTH EXTRACTION      Current Medications: Current Meds  Medication Sig   Cholecalciferol (VITAMIN D3) 1.25 MG (50000 UT) CAPS Take 1 capsule by mouth once a week.   ibuprofen (ADVIL) 800 MG tablet Take 800 mg by mouth every 6 (six) hours as needed. for pain     Allergies:   Patient has no known allergies.   Social History   Socioeconomic History   Marital status: Married    Spouse name: Not on file   Number of children: Not on file   Years of education: Not on file   Highest education level: Not on file  Occupational History   Not on file  Tobacco Use   Smoking status: Never   Smokeless tobacco: Never  Vaping Use   Vaping status: Never Used  Substance and Sexual Activity   Alcohol use: Not Currently   Drug use: Never   Sexual activity: Not on file  Other Topics Concern   Not on file  Social History Narrative   Not on file   Social Drivers of Health   Financial Resource Strain: Not on file  Food Insecurity: Not on file  Transportation Needs: Not on file  Physical Activity: Not on file  Stress: Not on file  Social Connections: Not on file     Family History: The patient's family history includes Hearing loss in his maternal grandfather; Heart attack (age of onset: 64) in his father. ROS:   Please see the history of present illness.    All 14 point review of systems negative except as described per history of present illness  EKGs/Labs/Other Studies Reviewed:         Recent Labs: 05/07/2024: ALT 44; BUN 8; Creatinine, Ser 0.84; Potassium 4.3; Sodium 141  Recent Lipid Panel    Component Value Date/Time   CHOL 166 05/07/2024 0827   TRIG 122 05/07/2024 0827   HDL 35 (L) 05/07/2024 0827   CHOLHDL 4.7 05/07/2024 0827   LDLCALC  109 (H) 05/07/2024 0827   LDLDIRECT 118 (H) 05/07/2024 0827    Physical Exam:    VS:  BP 118/86   Pulse 78   Ht 5' 4 (1.626 m)   Wt 192 lb 12.8 oz (87.5 kg)   SpO2 95%   BMI 33.09 kg/m     Wt Readings from Last 3 Encounters:  06/22/24 192 lb 12.8 oz (87.5 kg)  04/14/24 192 lb (87.1 kg)  02/04/20 179 lb (81.2 kg)     GEN:  Well nourished, well developed in no acute distress HEENT: Normal NECK: No JVD; No carotid bruits LYMPHATICS: No lymphadenopathy CARDIAC: RRR, no murmurs, no rubs, no gallops RESPIRATORY:  Clear to auscultation without rales, wheezing or rhonchi  ABDOMEN: Soft, non-tender, non-distended MUSCULOSKELETAL:  No edema; No deformity  SKIN: Warm and dry LOWER EXTREMITIES: no swelling NEUROLOGIC:  Alert and oriented x 3 PSYCHIATRIC:  Normal affect   ASSESSMENT:    1. Atypical chest pain   2. Dyspnea on exertion   3. Dyslipidemia    PLAN:    In order of problems listed above:  Atypical chest pain.  Coronary CT angio did not show an obstructive disease I think we can drop history of coronary artery disease.  No and normally has been noted. Dyspnea exertion he still playing soccer have no difficulty doing this encouraged him to be active.  We did discuss also Mediterranean diet. Dyslipidemia: I did review his K PN which show me his LDL of 109.  No need to treat with medication just good diet and exercise which we discussed. Lung mass noted likely benign based on description of radiologist however I want him to see pulmonologist to have better assessment of this issue   Medication Adjustments/Labs and Tests Ordered: Current medicines are reviewed at length with the patient today.  Concerns regarding medicines are outlined above.  No orders of the defined types were placed in this encounter.  Medication changes: No orders of the defined types were placed in this encounter.   Signed, Lamar DOROTHA Fitch, MD, [facility] 06/22/2024 3:04 PM    [facility]

## 2024-07-08 ENCOUNTER — Telehealth: Payer: Self-pay

## 2024-07-08 ENCOUNTER — Ambulatory Visit

## 2024-07-08 VITALS — BP 126/86 | HR 74 | Temp 98.6°F | Ht 64.0 in | Wt 193.4 lb

## 2024-07-08 DIAGNOSIS — J948 Other specified pleural conditions: Secondary | ICD-10-CM | POA: Diagnosis not present

## 2024-07-08 NOTE — Progress Notes (Signed)
 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  Patient states that 2 months ago he had some discomfort in his left anterior chest, workup included CT coronary calcium scoring which showed clean coronaries but showed a pleural-based lung mass.  On review of his imaging he had a CT abdomen when he had appendicitis in 2010 which showed the same mass there but it was slightly smaller.  He denies any symptoms other than that discomfort he had 2 months ago.  He denies any shortness of breath, cough, fevers, chills, night sweats, weight loss or loss of appetite.    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 The report date is 04/30/2021.     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       Family History  Problem Relation Age of Onset   Heart attack Father 71   Hearing loss Maternal Grandfather        older age     Social History   Socioeconomic History   Marital status: Married    Spouse name: Not on file   Number of children: Not on file   Years of education: Not on file   Highest education level: Not on file  Occupational History   Not on  file  Tobacco Use   Smoking status: Never   Smokeless tobacco: Never  Vaping Use   Vaping status: Never Used  Substance and Sexual Activity   Alcohol use: Not Currently   Drug use: Never   Sexual activity: Not on file  Other Topics Concern   Not on file  Social History Narrative   Not on file   Social Drivers of Health   Financial Resource Strain: Not on file  Food Insecurity: Not on file  Transportation Needs: Not on file  Physical Activity: Not on file  Stress: Not on file  Social Connections: Not on file  Intimate Partner Violence: Not on file     No Known Allergies   Outpatient Medications Prior to Visit  Medication Sig Dispense Refill   ibuprofen (ADVIL) 800 MG tablet Take 800 mg by mouth every 6 (six) hours as needed. for pain     Cholecalciferol (VITAMIN D3) 1.25 MG (50000 UT) CAPS Take 1 capsule by mouth once a week. (Patient not taking: Reported on 07/08/2024)     No facility-administered medications prior to visit.    ROS Reviewed all systems and reported negative except as above     Objective:   Vitals:   07/08/24 1356  BP: 126/86  Pulse: 74  Temp: 98.6 F (37 C)  SpO2: 95%  Weight: 193 lb 6.4 oz (87.7 kg)  Height: 5' 4 (1.626 m)  Physical Exam General: Middle-aged male not in acute distress Chest: Clear to auscultation bilaterally Heart: Regular rate and rhythm, normal S1, normal S2 Neuro: Grossly intact Extremities: Warm, well-perfused   CBC    Component Value Date/Time   WBC 7.7 12/23/2019 0440   RBC 5.72 12/23/2019 0440   HGB 16.6 12/23/2019 0440   HCT 47.7 12/23/2019 0440   PLT 265 12/23/2019 0440   MCV 83.4 12/23/2019 0440   MCH 29.0 12/23/2019 0440   MCHC 34.8 12/23/2019 0440   RDW 12.7 12/23/2019 0440   LYMPHSABS 3.0 12/23/2019 0440   MONOABS 0.5 12/23/2019 0440   EOSABS 0.1 12/23/2019 0440   BASOSABS 0.0 12/23/2019 0440     Chest imaging: CT chest interpretations as above.      Assessment & Plan:    Assessment & Plan Pleural mass Patient with a pleural mass that was present in 2010.  This is likely a benign growth.  This was communicated to the patient thoroughly.  We discussed options including surveillance given very slow growth versus IR CT-guided biopsy to further define the kind of tissue.  With his history of his schwannoma although rare, patient can have pleural-based schwannoma.  Alternative diagnoses include fibroma, lipoma or a solitary fibrous tumor of the pleura.  Given very slow growth over 15 years, patient has elected to go for CT-guided biopsy to further define what kind of tissue it is.  I do not recommend surgical resection in this case unless he has extensive symptoms however he has not had symptoms in 2 months.  Will follow-up in the clinic once tissue was obtained. Orders:   CT LUNG MASS BIOPSY; Future    Zola Herter, MD [facility] Office: [phone]

## 2024-07-08 NOTE — Telephone Encounter (Signed)
 At discharge for pt's office visit with Dr. Zaida on 07/08/24, he had asked if his lung mass were benign, what would be done for his chest pain. He did not want to wait on Dr. Zaida to answer as he was with another pt and instead requested that I call him back with the answer.  Per Dr. Donzetta, if the mass is benign and his chest pain subsides or is tolerable, then nothing will be done. If chest pain is bothersome, then we could refer to surgery for resection of the mass.   Called pt and LVM using interpretor Stoney, [phone]) requesting call back to answer his question.

## 2024-07-08 NOTE — Patient Instructions (Signed)
 It was nice meeting you in the clinic today.  We will get a CT-guided tissue biopsy with interventional radiology.  We can discuss once we have results.  Follow-up in the clinic in 3 months

## 2024-07-09 NOTE — Progress Notes (Unsigned)
 Dennis Cornet, MD  Dennis Carrillo PROCEDURE / BIOPSY REVIEW Date: 07/08/24  Requested Biopsy site: left chest / pleural mass Reason for request: Needs diagnosis Imaging review: Cardiac CTA  Decision: Approved Imaging modality to perform: CT Schedule with: Moderate Sedation Schedule for: Any VIR  Additional comments: Left pleural mass  Please contact me with questions, concerns, or if issue pertaining to this request arise.  Carrillo JONELLE Philip, MD [facility]       Previous Messages    ----- Message ----- From: Dennis Rosella CROME Sent: 07/08/2024   4:12 PM EDT To: Rosella CROME Dennis; Taryn F Rigney, RT; Ir Proc* Subject: CT LUNG MASS BIOPSY                            Procedure :CT LUNG MASS BIOPSY  Reason :Left lung pleural based growth Dx: Pleural mass [G05.1 (ICD-10-CM)]    History :CT CORONARY MORPH W/CTA COR W/SCORE W/CA W/CM &/OR WO/CM  Provider: Zaida Zola SAILOR, MD  Provider contact ; [phone]

## 2024-07-14 ENCOUNTER — Other Ambulatory Visit: Payer: Self-pay | Admitting: Radiology

## 2024-07-14 DIAGNOSIS — R918 Other nonspecific abnormal finding of lung field: Secondary | ICD-10-CM

## 2024-07-15 ENCOUNTER — Other Ambulatory Visit: Payer: Self-pay

## 2024-07-15 NOTE — Consult Note (Signed)
 Chief Complaint: Patient was seen in consultation today for lung mass  Referring Physician(s): Zaida Zola SAILOR  Supervising Physician: Hughes Simmonds  Patient Status: Dennis Carrillo  History of Present Illness: Dennis Carrillo is a 38 y.o. male with past medical history of atypical chest pain, dyspnea on exertion who was recently found to have a left pleural mass on CT Cardiac workup.  He is referred to [facility] for biopsy at the request of Dr. Zaida.   Dennis Carrillo presents today in his usual state of health. He has been NPO.  He is accompanied by his wife today who is available for transportation and post-procedure care. Visit complete with assistance from Spanish language interpreter services. He is aware of the goals, risk, and benefits of today's procedure and is agreeable to proceed.   Patient is a FULL CODE today.    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 The report date is 04/30/2021.     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 Ida Loader   WISDOM TOOTH EXTRACTION      Allergies: Patient has no known allergies.  Medications: Prior to Admission medications   Medication Sig Start Date End Date Taking? Authorizing Provider  Cholecalciferol (VITAMIN D3) 1.25 MG (50000 UT) CAPS Take 1 capsule by mouth once a week. Patient not taking: Reported on 07/08/2024 03/31/24   [provider]  ibuprofen (ADVIL) 800 MG tablet Take 800 mg by mouth every 6 (six) hours as needed. for pain    [provider]     Family History  Problem Relation Age of Onset   Heart attack Father 48   Hearing loss Maternal Grandfather        older age    Social History   Socioeconomic History   Marital status: Married    Spouse name: Not on file   Number of children: Not on file   Years of education: Not on file   Highest education level: Not on file  Occupational History   Not on file  Tobacco Use   Smoking status: Never   Smokeless tobacco: Never  Vaping Use   Vaping status: Never Used  Substance and Sexual Activity   Alcohol use: Not Currently   Drug use: Never   Sexual activity: Not on file  Other Topics Concern   Not on file  Social History Narrative   Not on file   Social Drivers of Health   Financial Resource Strain: Not on file  Food Insecurity: Not on file  Transportation Needs: Not on file  Physical Activity: Not on file  Stress: Not on file  Social Connections: Not on file     Review of Systems: A 12 point ROS discussed and pertinent positives are indicated in the HPI above.  All other systems are negative.  Review of Systems  Constitutional:  Negative for fatigue and fever.  Respiratory:  Negative for cough and shortness of breath.   Cardiovascular:  Negative for chest pain.  Gastrointestinal:  Negative for abdominal pain, nausea and vomiting.  Musculoskeletal:  Negative for back pain.  Psychiatric/Behavioral:  Negative for behavioral problems and confusion.     Vital  Signs: BP (!) 130/92   Pulse 71   Temp 98.2 F (36.8 C) (Oral)   Resp 16   Ht 5' 4 (1.626 m)   Wt 190 lb (86.2 kg)   SpO2 95%   BMI 32.61 kg/m   Physical Exam Vitals and nursing note reviewed.  Constitutional:      General: He is not in acute distress.    Appearance: Normal appearance. He is not ill-appearing.  HENT:     Mouth/Throat:     Pharynx: Oropharynx is clear.  Cardiovascular:     Rate and Rhythm: Normal rate and regular rhythm.  Pulmonary:     Effort: Pulmonary effort is normal.     Breath sounds: Normal breath sounds.  Abdominal:     General: Abdomen is flat. There is no distension.     Palpations: Abdomen is soft.  Skin:    General: Skin is warm and dry.  Neurological:     General: No focal deficit present.     Mental Status: He is alert and oriented to person, place, and time. Mental status is at baseline.  Psychiatric:        Mood and Affect: Mood normal.        Behavior: Behavior normal.        Thought Content: Thought content normal.        Judgment: Judgment normal.      MD Evaluation Airway: WNL Heart: WNL Abdomen: WNL Chest/ Lungs: WNL ASA  Classification: 3 Mallampati/Airway Score: Two   Imaging: No results found.  Labs:  CBC: Recent Labs    07/16/24 0855  WBC 7.0  HGB 16.5  HCT 46.2  PLT 227    COAGS: Recent Labs    07/16/24 0855  INR 0.9    BMP: Recent Labs    05/07/24 0827  NA 141  K 4.3  CL 103  CO2 22  GLUCOSE 86  BUN 8  CALCIUM 9.3  CREATININE 0.84    LIVER FUNCTION TESTS: Recent Labs    05/07/24 0827  BILITOT 0.6  AST 26  ALT 44  ALKPHOS 84  PROT 7.0  ALBUMIN  4.5    TUMOR MARKERS: No results for input(s): AFPTM, CEA, CA199, CHROMGRNA in the last 8760 hours.  Assessment and Plan: Left pleural mass Patient with past medical history of back/chest pain presents with recent finding of a left pleural mass on CT Heart evalulation.  IR consulted for left pleural/lung mass biopsy at the  request of Dr. Zaida. Case reviewed by Dr. Philip who approves patient for procedure.  Patient presents today in their usual state of health.  He has been NPO and is not currently on blood thinners.   Spanish interpreter used for visit today.   Risks and benefits of CT guided lung nodule biopsy was discussed with the patient including, but not limited to bleeding, hemoptysis, respiratory failure requiring  intubation, infection, pneumothorax requiring chest tube placement, stroke from air embolism or even death.  All of the patient's questions were answered and the patient is agreeable to proceed.  Consent signed and in chart.   Thank you for this interesting consult.  I greatly enjoyed meeting Dennis Carrillo and look forward to participating in their care.  A copy of this report was sent to the requesting provider on this date.  Electronically Signed: Exander Shaul Sue-Ellen Nylene Inlow, PA 07/16/2024, 9:31 AM   I spent a total of  30 Minutes   in face to face in clinical consultation, greater than 50% of which was counseling/coordinating care for left pleural mass.

## 2024-07-16 ENCOUNTER — Encounter (HOSPITAL_COMMUNITY): Payer: Self-pay

## 2024-07-16 ENCOUNTER — Ambulatory Visit (HOSPITAL_COMMUNITY)
Admission: RE | Admit: 2024-07-16 | Discharge: 2024-07-16 | Disposition: A | Discharge status: Home / Self Care | Source: Ambulatory Visit

## 2024-07-16 ENCOUNTER — Other Ambulatory Visit: Payer: Self-pay

## 2024-07-16 ENCOUNTER — Ambulatory Visit (HOSPITAL_COMMUNITY)
Admission: RE | Admit: 2024-07-16 | Discharge: 2024-07-16 | Disposition: A | Discharge status: Home / Self Care | Source: Ambulatory Visit | Attending: Interventional Radiology | Admitting: Interventional Radiology

## 2024-07-16 DIAGNOSIS — J948 Other specified pleural conditions: Secondary | ICD-10-CM | POA: Diagnosis present

## 2024-07-16 DIAGNOSIS — Z603 Acculturation difficulty: Secondary | ICD-10-CM | POA: Diagnosis not present

## 2024-07-16 DIAGNOSIS — D3610 Benign neoplasm of peripheral nerves and autonomic nervous system, unspecified: Secondary | ICD-10-CM | POA: Insufficient documentation

## 2024-07-16 DIAGNOSIS — R222 Localized swelling, mass and lump, trunk: Secondary | ICD-10-CM | POA: Insufficient documentation

## 2024-07-16 DIAGNOSIS — R918 Other nonspecific abnormal finding of lung field: Secondary | ICD-10-CM

## 2024-07-16 LAB — PROTIME-INR
INR: 0.9 (ref 0.8–1.2)
Prothrombin Time: 12.4 s (ref 11.4–15.2)

## 2024-07-16 LAB — CBC
HCT: 46.2 % (ref 39.0–52.0)
Hemoglobin: 16.5 g/dL (ref 13.0–17.0)
MCH: 29.2 pg (ref 26.0–34.0)
MCHC: 35.7 g/dL (ref 30.0–36.0)
MCV: 81.8 fL (ref 80.0–100.0)
Platelets: 227 K/uL (ref 150–400)
RBC: 5.65 MIL/uL (ref 4.22–5.81)
RDW: 12.9 % (ref 11.5–15.5)
WBC: 7 K/uL (ref 4.0–10.5)
nRBC: 0 % (ref 0.0–0.2)

## 2024-07-16 MED ORDER — HYDROCODONE-ACETAMINOPHEN 5-325 MG PO TABS
1.0000 | ORAL_TABLET | ORAL | Status: DC | PRN
  Administered 2024-07-16: 1 via ORAL
  Filled 2024-07-16: qty 2
  Filled 2024-07-16: qty 1
  Filled 2024-07-16: qty 2

## 2024-07-16 MED ORDER — FENTANYL CITRATE (PF) 100 MCG/2ML IJ SOLN
INTRAMUSCULAR | Status: AC
  Filled 2024-07-16: qty 2

## 2024-07-16 MED ORDER — LIDOCAINE 1 % OPTIME INJ - NO CHARGE
10.0000 mL | Freq: Once | INTRAMUSCULAR | Status: AC
  Administered 2024-07-16: 10 mL via INTRADERMAL
  Filled 2024-07-16: qty 10

## 2024-07-16 MED ORDER — LIDOCAINE HCL 1 % IJ SOLN
INTRAMUSCULAR | Status: AC
  Filled 2024-07-16: qty 10

## 2024-07-16 MED ORDER — SODIUM CHLORIDE 0.9 % IV SOLN: INTRAVENOUS | Status: DC

## 2024-07-16 MED ORDER — MIDAZOLAM HCL 2 MG/2ML IJ SOLN
INTRAMUSCULAR | Status: AC
  Filled 2024-07-16: qty 2

## 2024-07-16 MED ORDER — MIDAZOLAM HCL 2 MG/2ML IJ SOLN
INTRAMUSCULAR | Status: AC | PRN
  Administered 2024-07-16 (×2): 1 mg via INTRAVENOUS

## 2024-07-16 MED ORDER — FENTANYL CITRATE (PF) 100 MCG/2ML IJ SOLN
INTRAMUSCULAR | Status: AC | PRN
  Administered 2024-07-16: 25 ug via INTRAVENOUS
  Administered 2024-07-16: 50 ug via INTRAVENOUS
  Administered 2024-07-16: 25 ug via INTRAVENOUS

## 2024-07-16 NOTE — Procedures (Signed)
 Vascular and Interventional Radiology Procedure Note  Patient: Eliott Amparan DOB: 1985/11/30 Medical Record Number: [id number] Note Date/Time: 07/16/24 9:32 AM   Performing Physician: Thom Hall, MD Assistant(s): None  Diagnosis: L chest pleural mass   Procedure: LEFT CHEST WALL MASS BIOPSY  Anesthesia: Conscious Sedation Complications: None Estimated Blood Loss: Minimal Specimens: Sent for Pathology  Findings:  Successful CT-guided biopsy of Bx of a L chest pleural mass . A total of 3 samples were obtained. Hemostasis of the tract was achieved using Hemostatic Patch.  Plan: Bed rest for 1 hours.  See detailed procedure note with images in PACS. The patient tolerated the procedure well without incident or complication and was returned to Recovery in stable condition.    Thom Hall, MD [facility]   Pager. [phone] Clinic. [phone]

## 2024-07-16 NOTE — Progress Notes (Signed)
 Discharge instructions reviewed with patient and significant other Dennis Carrillo at bedside. Denies questions or concerns. No s/s of complications at incision site. Pain tolerable at the time of discharge. PT ambulated in the hallway was able to void. Denies n/v. PT escorted from unit via wheel chair to personal vehicle.

## 2024-07-20 ENCOUNTER — Other Ambulatory Visit: Payer: Self-pay

## 2024-07-20 ENCOUNTER — Ambulatory Visit: Payer: Self-pay

## 2024-07-20 DIAGNOSIS — J948 Other specified pleural conditions: Secondary | ICD-10-CM

## 2024-07-20 LAB — SURGICAL PATHOLOGY

## 2024-07-20 NOTE — Telephone Encounter (Signed)
 Used interpreter services zeke [phone]) to give patient results and let them know about the thoracic surgery referral

## 2024-07-20 NOTE — Telephone Encounter (Signed)
-----   Message from Zola LOISE Herter sent at 07/20/2024 10:55 AM EDT ----- Biopsy showing Schwannoma. This kind of tissue is benign but will continue to grow and cause symptoms, recommend resection by thoracic surgery. Referral sent to thoracic surgery ----- Message ----- From: Interface, Lab In Three Zero One Sent: 07/20/2024  10:28 AM EDT To: Zola LOISE Herter, MD

## 2024-07-27 ENCOUNTER — Ambulatory Visit
Attending: Thoracic Surgery (Cardiothoracic Vascular Surgery) | Admitting: Thoracic Surgery (Cardiothoracic Vascular Surgery)

## 2024-07-27 ENCOUNTER — Encounter: Payer: Self-pay | Admitting: Thoracic Surgery (Cardiothoracic Vascular Surgery)

## 2024-07-27 VITALS — BP 167/104 | HR 73 | Resp 20 | Ht 64.0 in | Wt 191.4 lb

## 2024-07-27 DIAGNOSIS — J948 Other specified pleural conditions: Secondary | ICD-10-CM | POA: Insufficient documentation

## 2024-07-27 NOTE — Progress Notes (Signed)
 PCP is Kathryne Marny GRADE, FNP Referring Provider is Zaida Zola SAILOR, MD  Chief Complaint  Patient presents with   pleural mass    Surgical consult/ CT lung BX 07/16/24/ [facility] Cardiac/Chest 7/17/258    HPI: [name] is sent for consultation regarding a left pleural mass.  He is accompanied by professional interpreter Hadassah Friday.  Dennis Carrillo is a 38 year old man with a history of a cervical schwannoma, arthritis, and a newly discovered left pleural mass.  He had a C4-6 laminectomy for tumor resection by Dr. Rockney in 2019.  Pathology showed schwannoma.  He recently presented with atypical chest pain.  He says this will come on unpredictably typically affects his left anterior chest.  Usually last 4 to 5 minutes.  Not exertional related.  He was referred to Dr. Krasowski.  A coronary CT was performed.  His coronary calcium score was 0.  He was incidentally noted to have a 3.7 cm pleural-based mass on the left side.  Review of an abdominal CT from 2010 showed the mass was present but much smaller at that time.  He underwent a CT-guided needle biopsy.  Pathology showed schwannoma.  He is very active.  His work involves heavy lifting.  He also plays soccer recreationally.  Married with 4 children.  Zubrod Score: At the time of surgery this patient's most appropriate activity status/level should be described as: [x]     0    Normal activity, no symptoms []     1    Restricted in physical strenuous activity but ambulatory, able to do out light work []     2    Ambulatory and capable of self care, unable to do work activities, up and about >50 % of waking hours                              []     3    Only limited self care, in bed greater than 50% of waking hours []     4    Completely disabled, no self care, confined to bed or chair []     5    Moribund  Past Medical History:  Diagnosis Date   Arthritis    Atypical chest pain    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 The report date is 04/30/2021.     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 Ida Loader   WISDOM TOOTH EXTRACTION      Family History  Problem Relation Age of Onset   Heart attack Father 10   Hearing loss Maternal Grandfather  older age    Social History Social History   Tobacco Use   Smoking status: Never   Smokeless tobacco: Never  Vaping Use   Vaping status: Never Used  Substance Use Topics   Alcohol use: Not Currently   Drug use: Never    Current Outpatient Medications  Medication Sig Dispense Refill   Cholecalciferol (VITAMIN D3) 1.25 MG (50000 UT) CAPS Take 1 capsule by mouth once a week.     gabapentin  (NEURONTIN ) 300 MG capsule Take 300 mg by mouth 3 (three) times daily.     ibuprofen (ADVIL) 800 MG tablet Take 800 mg by mouth every 6 (six) hours as needed. for pain     methocarbamol  (ROBAXIN ) 500 MG tablet Take 500 mg by mouth.     No current facility-administered medications for this visit.    No Known Allergies  Review of Systems  Constitutional:  Negative for activity change, fatigue and  unexpected weight change.  HENT:  Negative for trouble swallowing.   Respiratory:  Negative for shortness of breath and wheezing.   Cardiovascular:  Positive for chest pain (See HPI).  Genitourinary:  Negative for difficulty urinating and dysuria.  Musculoskeletal:  Positive for neck pain.  Neurological:  Negative for seizures and weakness.  Hematological:  Negative for adenopathy. Does not bruise/bleed easily.    BP (!) 167/104 (BP Location: Right Arm, Patient Position: Sitting, Cuff Size: Normal)   Pulse 73   Resp 20   Ht 5' 4 (1.626 m)   Wt 191 lb 6.4 oz (86.8 kg)   SpO2 96% Comment: RA  BMI 32.85 kg/m  Physical Exam Vitals reviewed.  Constitutional:      General: He is not in acute distress.    Appearance: Normal appearance.  HENT:     Head: Normocephalic and atraumatic.  Cardiovascular:     Rate and Rhythm: Normal rate and regular rhythm.     Pulses: Normal pulses.     Heart sounds: Normal heart sounds. No murmur heard.    No friction rub. No gallop.  Pulmonary:     Effort: Pulmonary effort is normal. No respiratory distress.     Breath sounds: Normal breath sounds. No rales.  Abdominal:     General: Abdomen is flat. There is no distension.     Palpations: Abdomen is soft.  Skin:    General: Skin is warm and dry.  Neurological:     General: No focal deficit present.     Mental Status: He is alert and oriented to person, place, and time.     Cranial Nerves: No cranial nerve deficit.     Motor: No weakness.     Diagnostic Tests: ADDENDUM REPORT: 04/26/2024 03:35   EXAM: OVER-READ INTERPRETATION  CT CHEST   The following report is an over-read performed by radiologist Dr. Suzen Dials of [facility] on 04/26/2024. This over-read does not include interpretation of cardiac or coronary anatomy or pathology. The coronary calcium score/coronary CTA interpretation by the cardiologist is attached.   COMPARISON:  None.    FINDINGS: Cardiovascular: There are no significant extracardiac vascular findings.   Mediastinum/Nodes: There are no enlarged lymph nodes within the visualized mediastinum.   Lungs/Pleura: There is no pleural effusion. A 2 mm pleural based anterolateral right lower lobe pulmonary nodule is noted (axial CT image 63, CT series 309).   A 3.7 cm well-defined homogeneous low attenuation (approximately 44.09 Hounsfield units) pleural based lung mass is seen within the anterolateral aspect of the left  lower lobe (axial CT image 152, CT series 308). This is seen within the visualized portion of left lung base on the prior abdomen pelvis CT (November 02, 2008) and measured 2.4 cm.   Upper abdomen: No significant findings in the visualized upper abdomen.   Musculoskeletal/Chest wall: No chest wall mass or suspicious osseous findings within the visualized chest.   IMPRESSION: 1. Very slowly growing, likely benign, 3.7 cm well-defined homogeneous low attenuation pleural based lung mass within the anterolateral aspect of the left lower lobe, as described above. 2. 2 mm pleural based anterolateral right lower lobe pulmonary nodule. No follow-up needed if patient is low-risk.This recommendation follows the consensus statement: Guidelines for Management of Incidental Pulmonary Nodules Detected on CT Images: From the Fleischner Society 2017; Radiology 2017; 284:228-243.     Electronically Signed   By: Suzen Dials M.D.   On: 04/26/2024 03:35 I personally reviewed the CT images.  3.7 cm rounded pleural-based mass left lateral chest wall.  FINAL MICROSCOPIC DIAGNOSIS:   A. SOFT TISSUE, LET CHEST PLEURAL MASS, NEEDLE CORE BIOPSY:       Schwannoma.       Negative for malignancy.   Impression: Dennis Carrillo is a 38 year old man with a history of a cervical schwannoma, arthritis, and a newly discovered left pleural mass.  Schwannoma left chest wall-hard to say  definitively, but most likely is the source of his chest pain.  Mass has been present for many years.  Also had a cervical spine mass which turned out to be a schwannoma in 2019.  I reassured him that this is a benign lesion and not a malignancy.  Treatment is surgical resection.  It is his decision whether to have surgery or not, based on the amount of pain he is experiencing.  He does understand that his pain could potentially be worse postoperatively than it is now.  More likely he would be left with numbness and a portion of his anterior chest wall.  I informed him of the general nature of the procedure.  The plan would be to do a left VATS and then a small mini thoracotomy to resect the lesion.  I informed him of the need for general anesthesia, the incisions to be used, the use of drains to postoperatively, expected hospital stay, and the overall recovery.  I would expect him to be out of work for 6 to 8 weeks afterwards.  I informed him of the indications, risks, benefits, and alternatives.  He understands the risks include, but are not limited to bleeding, possible need for transfusion, infection, chronic pain, DVT PE, as well as possibility of other unforeseeable and potentially fatal complications.  He wishes to think over his options and will let us  know if he would like to proceed.  Plan: Patient will call if he wants to proceed with left VATS for resection of schwannoma.  Elspeth JAYSON Millers, MD [facility] [phone]
